# Patient Record
Sex: Male | Born: 1995
Health system: Southern US, Community
[De-identification: ages and names within clinical notes are randomized; demographics above are authoritative.]

## PROBLEM LIST (undated history)

## (undated) DIAGNOSIS — F909 Attention-deficit hyperactivity disorder, unspecified type: Secondary | ICD-10-CM

## (undated) DIAGNOSIS — F32A Depression, unspecified: Secondary | ICD-10-CM

## (undated) DIAGNOSIS — F419 Anxiety disorder, unspecified: Secondary | ICD-10-CM

## (undated) DIAGNOSIS — F39 Unspecified mood [affective] disorder: Secondary | ICD-10-CM

## (undated) HISTORY — DX: Attention-deficit hyperactivity disorder, unspecified type: F90.9

## (undated) HISTORY — PX: OTHER SURGICAL HISTORY: SHX169

## (undated) HISTORY — DX: Anxiety disorder, unspecified: F41.9

## (undated) HISTORY — DX: Depression, unspecified: F32.A

---
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
## 2016-01-04 DIAGNOSIS — J301 Allergic rhinitis due to pollen: Secondary | ICD-10-CM | POA: Diagnosis not present

## 2016-01-04 DIAGNOSIS — J3089 Other allergic rhinitis: Secondary | ICD-10-CM | POA: Diagnosis not present

## 2016-01-04 DIAGNOSIS — J3081 Allergic rhinitis due to animal (cat) (dog) hair and dander: Secondary | ICD-10-CM | POA: Diagnosis not present

## 2016-01-11 DIAGNOSIS — J321 Chronic frontal sinusitis: Secondary | ICD-10-CM | POA: Diagnosis not present

## 2016-01-11 DIAGNOSIS — J320 Chronic maxillary sinusitis: Secondary | ICD-10-CM | POA: Diagnosis not present

## 2016-01-11 MED FILL — FLUTICASONE PROP 50 MCG SPR: 50 | 30 days supply | Qty: 16 | Fill #0

## 2016-01-11 MED FILL — DOXYCYCLINE HYCLATE 100 MG: 100 | 10 days supply | Qty: 20 | Fill #0

## 2016-01-15 DIAGNOSIS — F901 Attention-deficit hyperactivity disorder, predominantly hyperactive type: Secondary | ICD-10-CM | POA: Diagnosis not present

## 2016-02-05 DIAGNOSIS — F901 Attention-deficit hyperactivity disorder, predominantly hyperactive type: Secondary | ICD-10-CM | POA: Diagnosis not present

## 2016-02-06 DIAGNOSIS — J3089 Other allergic rhinitis: Secondary | ICD-10-CM | POA: Diagnosis not present

## 2016-02-06 DIAGNOSIS — J3081 Allergic rhinitis due to animal (cat) (dog) hair and dander: Secondary | ICD-10-CM | POA: Diagnosis not present

## 2016-02-06 DIAGNOSIS — J301 Allergic rhinitis due to pollen: Secondary | ICD-10-CM | POA: Diagnosis not present

## 2016-02-13 DIAGNOSIS — J3081 Allergic rhinitis due to animal (cat) (dog) hair and dander: Secondary | ICD-10-CM | POA: Diagnosis not present

## 2016-02-13 DIAGNOSIS — J3089 Other allergic rhinitis: Secondary | ICD-10-CM | POA: Diagnosis not present

## 2016-02-13 DIAGNOSIS — J301 Allergic rhinitis due to pollen: Secondary | ICD-10-CM | POA: Diagnosis not present

## 2016-02-17 ENCOUNTER — Ambulatory Visit (HOSPITAL_COMMUNITY)
Admission: EM | Admit: 2016-02-17 | Discharge: 2016-02-17 | Disposition: A | Discharge status: Home / Self Care | Payer: 59 | Attending: Family Medicine | Admitting: Family Medicine

## 2016-02-17 ENCOUNTER — Encounter (HOSPITAL_COMMUNITY): Payer: Self-pay | Admitting: Emergency Medicine

## 2016-02-17 DIAGNOSIS — R0981 Nasal congestion: Secondary | ICD-10-CM | POA: Diagnosis not present

## 2016-02-17 MED ORDER — AZITHROMYCIN 250 MG PO TABS
ORAL_TABLET | ORAL | Status: DC
Start: 2016-02-17 — End: 2016-09-17

## 2016-02-17 NOTE — ED Provider Notes (Signed)
CSN: [id number]     Arrival date & time 02/17/16  1301 History   First MD Initiated Contact with Patient 02/17/16 1350     Chief Complaint  Patient presents with  . Facial Pain  . Nasal Congestion  . Headache   (Consider location/radiation/quality/duration/timing/severity/associated sxs/prior Treatment) HPI Cough congestion, dizzyness, headache for the last several days.  Similar symptoms about 1 month ago. Did not finish antibx at that time. Requesting zpak by name. No fever.  Past Medical History  Diagnosis Date  . Attention deficit hyperactivity disorder (ADHD)   . Mood disorder (HCC)    History reviewed. No pertinent past surgical history. History reviewed. No pertinent family history. Social History  Substance Use Topics  . Smoking status: Never Smoker   . Smokeless tobacco: None  . Alcohol Use: No    Review of Systems Nasal congestion feeling sick Allergies  Penicillins and Risperidone and related  Home Medications   Prior to Admission medications   Medication Sig Start Date End Date Taking? Authorizing Provider  cetirizine (ZYRTEC) 10 MG tablet Take 10 mg by mouth daily.   Yes Historical Provider, MD  fluticasone (FLONASE) 50 MCG/ACT nasal spray Place into both nostrils daily.   Yes Historical Provider, MD  clonazePAM (KLONOPIN) 0.5 MG tablet Take 1 tablet (0.5 mg total) by mouth daily. If needed for anxiety attack 02/15/15   Tonye Pearsonobert P Doolittle, MD  cyclobenzaprine (FLEXERIL) 10 MG tablet Take 1 tablet (10 mg total) by mouth at bedtime. 01/04/15   Tonye Pearsonobert P Doolittle, MD  Methylphenidate [facility](DAYTRANA TD) Place onto the skin.    Historical Provider, MD   Meds Ordered and Administered this Visit  Medications - No data to display  BP 124/81 mmHg  Pulse 70  Temp(Src) 97.5 F (36.4 C) (Oral)  Resp 16  SpO2 99% No data found.   Physical Exam NURSES NOTES AND VITAL SIGNS REVIEWED. CONSTITUTIONAL: Well developed, well nourished, no acute distress HEENT: normocephalic,  atraumatic, right and left TM's are normal EYES: Conjunctiva normal NECK:normal ROM, supple, no adenopathy PULMONARY:No respiratory distress, normal effort, Lungs: CTAb/l, no wheezes, or increased work of breathing CARDIOVASCULAR: RRR, no murmur ABDOMEN: soft, ND, NT, +'ve BS MUSCULOSKELETAL: Normal ROM of all extremities,  SKIN: warm and dry without rash PSYCHIATRIC: Mood and affect, behavior are normal  ED Course  Procedures (including critical care time)  Labs Review Labs Reviewed - No data to display  Imaging Review No results found.   Visual Acuity Review  Right Eye Distance:   Left Eye Distance:   Bilateral Distance:    Right Eye Near:   Left Eye Near:    Bilateral Near:      Rx zpak   MDM   1. Nasal sinus congestion     Patient is reassured that there are no issues that require transfer to higher level of care at this time or additional tests. Patient is advised to continue home symptomatic treatment. Patient is advised that if there are new or worsening symptoms to attend the emergency department, contact primary care provider, or return to UC. Instructions of care provided discharged home in stable condition.    THIS NOTE WAS GENERATED USING A VOICE RECOGNITION SOFTWARE PROGRAM. ALL REASONABLE EFFORTS  WERE MADE TO PROOFREAD THIS DOCUMENT FOR ACCURACY.  I have verbally reviewed the discharge instructions with the patient. A printed AVS was given to the patient.  All questions were answered prior to discharge.      Tharon AquasFrank C Patrick, PA 02/17/16 1414

## 2016-02-17 NOTE — Discharge Instructions (Signed)
Sinusitis, Adult  Sinusitis is redness, soreness, and puffiness (inflammation) of the air pockets in the bones of your face (sinuses). The redness, soreness, and puffiness can cause air and mucus to get trapped in your sinuses. This can allow germs to grow and cause an infection.   HOME CARE    Drink enough fluids to keep your pee (urine) clear or pale yellow.   Use a humidifier in your home.   Run a hot shower to create steam in the bathroom. Sit in the bathroom with the door closed. Breathe in the steam 3-4 times a day.   Put a warm, moist washcloth on your face 3-4 times a day, or as told by your doctor.   Use salt water sprays (saline sprays) to wet the thick fluid in your nose. This can help the sinuses drain.   Only take medicine as told by your doctor.  GET HELP RIGHT AWAY IF:    Your pain gets worse.   You have very bad headaches.   You are sick to your stomach (nauseous).   You throw up (vomit).   You are very sleepy (drowsy) all the time.   Your face is puffy (swollen).   Your vision changes.   You have a stiff neck.   You have trouble breathing.  MAKE SURE YOU:    Understand these instructions.   Will watch your condition.   Will get help right away if you are not doing well or get worse.     This information is not intended to replace advice given to you by your health care provider. Make sure you discuss any questions you have with your health care provider.     Document Released: 04/07/2008 Document Revised: 11/10/2014 Document Reviewed: 05/25/2012  Elsevier Interactive Patient Education 2016 Elsevier Inc.

## 2016-02-17 NOTE — ED Notes (Signed)
[facility] a [facility] of sinus pain and pressure, headache and chest congestion x 1 week. The patient stated that he has a hx of allergy issues.

## 2016-02-26 DIAGNOSIS — F901 Attention-deficit hyperactivity disorder, predominantly hyperactive type: Secondary | ICD-10-CM | POA: Diagnosis not present

## 2016-03-19 DIAGNOSIS — F901 Attention-deficit hyperactivity disorder, predominantly hyperactive type: Secondary | ICD-10-CM | POA: Diagnosis not present

## 2016-04-14 DIAGNOSIS — F901 Attention-deficit hyperactivity disorder, predominantly hyperactive type: Secondary | ICD-10-CM | POA: Diagnosis not present

## 2016-05-19 DIAGNOSIS — F901 Attention-deficit hyperactivity disorder, predominantly hyperactive type: Secondary | ICD-10-CM | POA: Diagnosis not present

## 2016-06-02 DIAGNOSIS — J3081 Allergic rhinitis due to animal (cat) (dog) hair and dander: Secondary | ICD-10-CM | POA: Diagnosis not present

## 2016-06-02 DIAGNOSIS — H1045 Other chronic allergic conjunctivitis: Secondary | ICD-10-CM | POA: Diagnosis not present

## 2016-06-02 DIAGNOSIS — J301 Allergic rhinitis due to pollen: Secondary | ICD-10-CM | POA: Diagnosis not present

## 2016-06-02 DIAGNOSIS — J3089 Other allergic rhinitis: Secondary | ICD-10-CM | POA: Diagnosis not present

## 2016-06-05 DIAGNOSIS — J301 Allergic rhinitis due to pollen: Secondary | ICD-10-CM | POA: Diagnosis not present

## 2016-06-05 DIAGNOSIS — J3081 Allergic rhinitis due to animal (cat) (dog) hair and dander: Secondary | ICD-10-CM | POA: Diagnosis not present

## 2016-06-05 DIAGNOSIS — J3089 Other allergic rhinitis: Secondary | ICD-10-CM | POA: Diagnosis not present

## 2016-06-16 DIAGNOSIS — F901 Attention-deficit hyperactivity disorder, predominantly hyperactive type: Secondary | ICD-10-CM | POA: Diagnosis not present

## 2016-07-08 DIAGNOSIS — F901 Attention-deficit hyperactivity disorder, predominantly hyperactive type: Secondary | ICD-10-CM | POA: Diagnosis not present

## 2016-07-11 ENCOUNTER — Telehealth (HOSPITAL_COMMUNITY): Payer: Self-pay

## 2016-07-11 DIAGNOSIS — R0981 Nasal congestion: Secondary | ICD-10-CM | POA: Diagnosis not present

## 2016-07-11 MED FILL — predniSONE 20 MG TABS: 20 | 6 days supply | Qty: 12 | Fill #0

## 2016-07-21 DIAGNOSIS — F902 Attention-deficit hyperactivity disorder, combined type: Secondary | ICD-10-CM | POA: Diagnosis not present

## 2016-07-21 MED FILL — ALPRAZolam 0.5 MG TABS: 0.5 | 30 days supply | Qty: 60 | Fill #0

## 2016-07-21 MED FILL — DEXMETHYLPHENIDATE 10 MG TA: 10 | 30 days supply | Qty: 60 | Fill #0

## 2016-07-29 DIAGNOSIS — F901 Attention-deficit hyperactivity disorder, predominantly hyperactive type: Secondary | ICD-10-CM | POA: Diagnosis not present

## 2016-08-19 DIAGNOSIS — F901 Attention-deficit hyperactivity disorder, predominantly hyperactive type: Secondary | ICD-10-CM | POA: Diagnosis not present

## 2016-09-09 DIAGNOSIS — F901 Attention-deficit hyperactivity disorder, predominantly hyperactive type: Secondary | ICD-10-CM | POA: Diagnosis not present

## 2016-09-11 ENCOUNTER — Emergency Department (HOSPITAL_COMMUNITY)
Admission: EM | Admit: 2016-09-11 | Discharge: 2016-09-11 | Disposition: A | Discharge status: Home / Self Care | Payer: 59 | Attending: Emergency Medicine | Admitting: Emergency Medicine

## 2016-09-11 ENCOUNTER — Encounter (HOSPITAL_COMMUNITY): Payer: Self-pay | Admitting: *Deleted

## 2016-09-11 DIAGNOSIS — R197 Diarrhea, unspecified: Secondary | ICD-10-CM | POA: Insufficient documentation

## 2016-09-11 DIAGNOSIS — F909 Attention-deficit hyperactivity disorder, unspecified type: Secondary | ICD-10-CM | POA: Diagnosis not present

## 2016-09-11 DIAGNOSIS — R112 Nausea with vomiting, unspecified: Secondary | ICD-10-CM | POA: Insufficient documentation

## 2016-09-11 LAB — COMPREHENSIVE METABOLIC PANEL
ALBUMIN: 4.5 g/dL (ref 3.5–5.0)
ALK PHOS: 85 U/L (ref 38–126)
ALT: 18 U/L (ref 17–63)
ANION GAP: 11 (ref 5–15)
AST: 24 U/L (ref 15–41)
BILIRUBIN TOTAL: 0.9 mg/dL (ref 0.3–1.2)
BUN: 11 mg/dL (ref 6–20)
CALCIUM: 9.9 mg/dL (ref 8.9–10.3)
CO2: 22 mmol/L (ref 22–32)
Chloride: 106 mmol/L (ref 101–111)
Creatinine, Ser: 0.93 mg/dL (ref 0.61–1.24)
GFR calc non Af Amer: >60 mL/min (ref >60)
Glucose, Bld: 127 mg/dL — ABNORMAL HIGH (ref 65–99)
POTASSIUM: 4.2 mmol/L (ref 3.5–5.1)
SODIUM: 139 mmol/L (ref 135–145)
TOTAL PROTEIN: 7.4 g/dL (ref 6.5–8.1)

## 2016-09-11 LAB — URINE MICROSCOPIC-ADD ON

## 2016-09-11 LAB — URINALYSIS, ROUTINE W REFLEX MICROSCOPIC
Bilirubin Urine: NEGATIVE
Glucose, UA: NEGATIVE mg/dL
Hgb urine dipstick: NEGATIVE
Ketones, ur: NEGATIVE mg/dL
LEUKOCYTES UA: NEGATIVE
NITRITE: NEGATIVE
PH: 8.5 — AB (ref 5.0–8.0)
Protein, ur: 30 mg/dL — AB
SPECIFIC GRAVITY, URINE: 1.024 (ref 1.005–1.030)

## 2016-09-11 LAB — CBC
HEMATOCRIT: 44 % (ref 39.0–52.0)
HEMOGLOBIN: 15.9 g/dL (ref 13.0–17.0)
MCH: 31.3 pg (ref 26.0–34.0)
MCHC: 36.1 g/dL — ABNORMAL HIGH (ref 30.0–36.0)
MCV: 86.6 fL (ref 78.0–100.0)
Platelets: 284 10*3/uL (ref 150–400)
RBC: 5.08 MIL/uL (ref 4.22–5.81)
RDW: 11.7 % (ref 11.5–15.5)
WBC: 10.5 10*3/uL (ref 4.0–10.5)

## 2016-09-11 LAB — LIPASE, BLOOD: Lipase: 22 U/L (ref 11–51)

## 2016-09-11 MED ORDER — ONDANSETRON HCL 4 MG/2ML IJ SOLN
4.0000 mg | Freq: Once | INTRAMUSCULAR | Status: AC
  Administered 2016-09-11: 4 mg via INTRAVENOUS
  Filled 2016-09-11: qty 2

## 2016-09-11 MED ORDER — ONDANSETRON HCL 4 MG PO TABS: 4.0000 mg | ORAL_TABLET | Freq: Three times a day (TID) | ORAL | 0 refills | Status: DC | PRN

## 2016-09-11 MED ORDER — SODIUM CHLORIDE 0.9 % IV BOLUS (SEPSIS)
1000.0000 mL | Freq: Once | INTRAVENOUS | Status: AC
  Administered 2016-09-11: 1000 mL via INTRAVENOUS

## 2016-09-11 MED FILL — ONDANSETRON HCL 4 MG TABLET: 4 | 4 days supply | Qty: 12 | Fill #0

## 2016-09-11 NOTE — ED Triage Notes (Signed)
Pt states multiple bouts of diarrhea and emesis since this am.  Pale and diaphoretic.  Denies any med hx.

## 2016-09-11 NOTE — ED Provider Notes (Signed)
[facility]-EMERGENCY DEPT Provider Note   CSN: [id number] Arrival date & time: 09/11/16  1227     History   Chief Complaint Chief Complaint  Patient presents with  . Diarrhea  . Emesis    HPI Patrick Burnett is a 20 y.o. male.  HPI  20 year old male who presents with nausea, vomiting, and diarrhea. He has a history of ADHD, no prior abdominal surgeries. States that he was in his usual state of health up until this morning. He will At 7 AM feeling unwell. He had 8-9 episodes of nonbloody and nonbilious emesis. Also had multiple episodes of diarrhea, abdominal cramping, and did notice a streak of blood in his diarrhea. No fevers but states that he one point was very diaphoretic and lightheaded and pale. No known sick contacts, but states eating cafeteria food yesterday and there has been multiple episodes of food poisoning through this cafeteria. No recent travel or antibiotics.  Past Medical History:  Diagnosis Date  . Attention deficit hyperactivity disorder (ADHD)   . Mood disorder [facility](HCC)     Patient Active Problem List   Diagnosis Date Noted  . Poor sleep hygiene 01/04/2015  . Mood disorder (HCC) 12/31/2012  . ADHD (attention deficit hyperactivity disorder) 12/30/2012  . Insomnia 12/30/2012    History reviewed. No pertinent surgical history.     Home Medications    Prior to Admission medications   Medication Sig Start Date End Date Taking? Authorizing Provider  cetirizine (ZYRTEC) 10 MG tablet Take 10 mg by mouth daily.   Yes Historical Provider, MD  fluticasone (FLONASE) 50 MCG/ACT nasal spray Place into both nostrils daily.   Yes Historical Provider, MD  azithromycin (ZITHROMAX Z-PAK) 250 MG tablet 2 tablets day 1, then 1 tablet days 2-5 by mouth. Patient not taking: Reported on 09/11/2016 02/17/16   Tharon AquasFrank C Patrick, PA  clonazePAM (KLONOPIN) 0.5 MG tablet Take 1 tablet (0.5 mg total) by mouth daily. If needed for anxiety attack 02/15/15   Tonye Pearsonobert P Doolittle, MD    cyclobenzaprine (FLEXERIL) 10 MG tablet Take 1 tablet (10 mg total) by mouth at bedtime. 01/04/15   Tonye Pearsonobert P Doolittle, MD  Methylphenidate [facility](DAYTRANA TD) Place onto the skin.    Historical Provider, MD  ondansetron (ZOFRAN) 4 MG tablet Take 1 tablet (4 mg total) by mouth every 8 (eight) hours as needed for nausea or vomiting. 09/11/16   Lavera Guiseana Duo Denard Tuminello, MD    Family History No family history on file. Reviewed, noncontributory. Social History Social History  Substance Use Topics  . Smoking status: Never Smoker  . Smokeless tobacco: Never Used  . Alcohol use No     Allergies   Amoxicillin and Risperidone and related   Review of Systems Review of Systems 10/14 systems reviewed and are negative other than those stated in the HPI   Physical Exam Updated Vital Signs BP 133/79 (BP Location: Left Arm)   Pulse 69   Temp 98.7 F (37.1 C) (Oral)   Resp 15   Ht 5\' 7"  (1.702 m)   Wt 150 lb (68 kg)   SpO2 100%   BMI 23.49 kg/m   Physical Exam Physical Exam  Nursing note and vitals reviewed. Constitutional: non-toxic, and in no acute distress Head: Normocephalic and atraumatic.  Mouth/Throat: Oropharynx is clear and dry.  Neck: Normal range of motion. Neck supple.  Cardiovascular: Normal rate and regular rhythm. No edema   Pulmonary/Chest: Effort normal and breath sounds normal.  Abdominal: Soft. There is no tenderness. There  is no rebound and no guarding.  Musculoskeletal: Normal range of motion.  Neurological: Alert, no facial droop, fluent speech, moves all extremities symmetrically Skin: Skin is warm and dry.  Psychiatric: Cooperative   ED Treatments / Results  Labs (all labs ordered are listed, but only abnormal results are displayed) Labs Reviewed  COMPREHENSIVE METABOLIC PANEL - Abnormal; Notable for the following:       Result Value   Glucose, Bld 127 (*)    All other components within normal limits  CBC - Abnormal; Notable for the following:    MCHC 36.1 (*)     All other components within normal limits  URINALYSIS, ROUTINE W REFLEX MICROSCOPIC (NOT AT [facility]) - Abnormal; Notable for the following:    pH 8.5 (*)    Protein, ur 30 (*)    All other components within normal limits  URINE MICROSCOPIC-ADD ON - Abnormal; Notable for the following:    Squamous Epithelial / LPF 0-5 (*)    Bacteria, UA MANY (*)    Casts HYALINE CASTS (*)    All other components within normal limits  LIPASE, BLOOD    EKG  EKG Interpretation None       Radiology No results found.  Procedures Procedures (including critical care time)  Medications Ordered in ED Medications  sodium chloride 0.9 % bolus 1,000 mL (0 mLs Intravenous Stopped 09/11/16 1541)  sodium chloride 0.9 % bolus 1,000 mL (0 mLs Intravenous Stopped 09/11/16 1541)  ondansetron (ZOFRAN) injection 4 mg (4 mg Intravenous Given 09/11/16 1428)     Initial Impression / Assessment and Plan / ED Course  I have reviewed the triage vital signs and the nursing notes.  Pertinent labs & imaging results that were available during my care of the patient were reviewed by me and considered in my medical decision making (see chart for details).  Clinical Course     20 year old male who presents with nausea, vomiting, and diarrhea. Afebrile and hemodynamically stable. Mildly dry on exam, was soft and benign abdomen. Exam not consistent with that of appendicitis or diverticulitis, or other surgical or emergent intraabdominal process at this time. Basic blood work is reassuring. Given IV fluids and antibiotics, and feels improved. Abdomen remains benign, he is able to tolerate by mouth challenge. I feel that he is stable for discharge home with continued outpatient supportive management.   The patient appears reasonably screened and/or stabilized for discharge and I doubt any other medical condition or other [facility] requiring further screening, evaluation, or treatment in the ED at this time prior to discharge.  Strict  return and follow-up instructions reviewed. He expressed understanding of all discharge instructions and felt comfortable with the plan of care.   Final Clinical Impressions(s) / ED Diagnoses   Final diagnoses:  Nausea vomiting and diarrhea    New Prescriptions New Prescriptions   ONDANSETRON (ZOFRAN) 4 MG TABLET    Take 1 tablet (4 mg total) by mouth every 8 (eight) hours as needed for nausea or vomiting.     Lavera Guiseana Duo Hillel Card, MD 09/11/16 [phone]

## 2016-09-11 NOTE — Discharge Instructions (Signed)
Return without fail for worsening symptoms, including fever, escalating pain, severe bleeding, intractable vomiting despite nausea medications, concern for dehydration or any other symptoms concerning to you.

## 2016-09-12 ENCOUNTER — Encounter: Payer: Self-pay | Admitting: Gastroenterology

## 2016-09-12 DIAGNOSIS — R112 Nausea with vomiting, unspecified: Secondary | ICD-10-CM | POA: Diagnosis not present

## 2016-09-17 ENCOUNTER — Encounter: Payer: Self-pay | Admitting: Gastroenterology

## 2016-09-17 ENCOUNTER — Ambulatory Visit (INDEPENDENT_AMBULATORY_CARE_PROVIDER_SITE_OTHER): Payer: 59 | Admitting: Gastroenterology

## 2016-09-17 VITALS — BP 84/50 | HR 80 | Ht 67.52 in | Wt 147.5 lb

## 2016-09-17 DIAGNOSIS — R197 Diarrhea, unspecified: Secondary | ICD-10-CM | POA: Diagnosis not present

## 2016-09-17 DIAGNOSIS — K625 Hemorrhage of anus and rectum: Secondary | ICD-10-CM | POA: Diagnosis not present

## 2016-09-17 DIAGNOSIS — R1032 Left lower quadrant pain: Secondary | ICD-10-CM | POA: Diagnosis not present

## 2016-09-17 MED ORDER — NA SULFATE-K SULFATE-MG SULF 17.5-3.13-1.6 GM/177ML PO SOLN: 1.0000 | Freq: Once | ORAL | 0 refills | Status: AC

## 2016-09-17 MED ORDER — NA SULFATE-K SULFATE-MG SULF 17.5-3.13-1.6 GM/177ML PO SOLN: 1.0000 | Freq: Once | ORAL | 0 refills | Status: DC

## 2016-09-17 NOTE — Progress Notes (Signed)
09/17/2016 Patrick Burnett [id number] 16-Feb-1996   HISTORY OF PRESENT ILLNESS:  This is a 20 year old male who is new to our practice.  He is here today with his mom.  Has several complaints including intermittent diarrhea that he has been experiencing on and off for the past 8 months.  He thinks that the diarrhea is related to eating the food at his college ([employer]) and says that while he was eating at home all summer he did not have any diarrhea at all.  Also then has alternating episodes of constipation that are associated with abdominal pain.  More acutely, last week he was seen in the ER for complaints of diarrhea, vomiting, abdominal pain, and blood in his stool.  Was treated with dose of antibiotics, IVFs, pain medication, and antiemetics.  CBC, CMP, and lipase were unremarkable.  Bleeding has since resolved but patient describes this as moderate amounts on a couple of occasions.  This seems to be his biggest concern today.  He was very dramatic in the office today complaining of dizziness.  Patient's BP was low but then orthostatic BP's were taken and were ok.  It was offered to him for us to call EMS to go to the ED if he did not feel well enough to go home but declined after causing some slight commotion with the staff.  Past Medical History:  Diagnosis Date  . Attention deficit hyperactivity disorder (ADHD)   . Mood disorder [facility](HCC)    Past Surgical History:  Procedure Laterality Date  . none      reports that he has never smoked. He has never used smokeless tobacco. He reports that he does not drink alcohol or use drugs. family history includes Anxiety disorder in his father and mother; Colon cancer in his paternal grandfather; Diabetes in his father; Hypercholesterolemia in his father and mother; Sleep apnea in his mother. Allergies  Allergen Reactions  . Amoxicillin Nausea And Vomiting  . Risperidone And Related       Outpatient Encounter Prescriptions as of 09/17/2016    Medication Sig  . ALPRAZolam (XANAX) 0.25 MG tablet Take 0.25 mg by mouth as needed for anxiety.  . fluticasone (FLONASE) 50 MCG/ACT nasal spray Place into both nostrils daily.  Marland Kitchen. loratadine (CLARITIN) 10 MG tablet Take 10 mg by mouth daily.  . [DISCONTINUED] azithromycin (ZITHROMAX Z-PAK) 250 MG tablet 2 tablets day 1, then 1 tablet days 2-5 by mouth. (Patient not taking: Reported on 09/11/2016)  . [DISCONTINUED] cetirizine (ZYRTEC) 10 MG tablet Take 10 mg by mouth daily.  . [DISCONTINUED] clonazePAM (KLONOPIN) 0.5 MG tablet Take 1 tablet (0.5 mg total) by mouth daily. If needed for anxiety attack  . [DISCONTINUED] cyclobenzaprine (FLEXERIL) 10 MG tablet Take 1 tablet (10 mg total) by mouth at bedtime.  . [DISCONTINUED] Methylphenidate (DAYTRANA TD) Place onto the skin.  . [DISCONTINUED] ondansetron (ZOFRAN) 4 MG tablet Take 1 tablet (4 mg total) by mouth every 8 (eight) hours as needed for nausea or vomiting.   No facility-administered encounter medications on file as of 09/17/2016.      REVIEW OF SYSTEMS  : All other systems reviewed and negative except where noted in the History of Present Illness.   PHYSICAL EXAM: BP (!) 84/50   Pulse 80   Ht 5' 7.52" (1.715 m)   Wt 147 lb 8 oz (66.9 kg)   BMI 22.75 kg/m  General: Well developed white male in no acute distress; laying on the bed  but appears comfortable. Head: Normocephalic and atraumatic Eyes:  Sclerae anicteric, conjunctiva pink. Ears: Normal auditory acuity Lungs: Clear throughout to auscultation Heart: Regular rate and rhythm Abdomen: Soft, non-distended.  Normal bowel sounds.  Diffuse TTP > in the LLQ. Rectal:  Will be done at the time of colonoscopy. Musculoskeletal: Symmetrical with no gross deformities  Skin: No lesions on visible extremities Extremities: No edema  Neurological: Alert oriented x 4, grossly non-focal Psychological:  Alert and cooperative. Normal mood and affect  ASSESSMENT AND PLAN: -20 year old  male with chronic complaints of alternating diarrhea and constipation with associated abdominal pain, which may be dietary/stress related, IBS (he is convinced that it is just when he eats at the cafeteria at his college).  More acutely he had acute vomiting, diarrhea, and abdominal with associated bloody stools.  Possibly infectious source.  Basic labs unremarkable in ED.  Patient most concerned about the rectal bleeding, which has now resolved.  This was self limited and likely hemorrhoidal in origin/assoicated with his acute diarrheal illness.  Will schedule for colonoscopy for piece of mind of patient and his mother.  The risks, benefits, and alternatives to colonoscopy were discussed with the patient and he consents to proceed.    CC:  Catha GosselinLittle, Kevin, MD

## 2016-09-17 NOTE — Patient Instructions (Signed)
You have been scheduled for a colonoscopy. Please follow written instructions given to you at your visit today.  Please pick up your prep supplies at the pharmacy within the next 1-3 days. If you use inhalers (even only as needed), please bring them with you on the day of your procedure. Your physician has requested that you go to www.startemmi.com and enter the access code given to you at your visit today. This web site gives a general overview about your procedure. However, you should still follow specific instructions given to you by our office regarding your preparation for the procedure.   Make sure to hydrate well with water or Gatorade  Follow up with your PCP, or, if necessary, the ER or Urgent Care, for your headaches and dizziness

## 2016-09-30 DIAGNOSIS — F901 Attention-deficit hyperactivity disorder, predominantly hyperactive type: Secondary | ICD-10-CM | POA: Diagnosis not present

## 2016-10-04 ENCOUNTER — Encounter: Payer: Self-pay | Admitting: Gastroenterology

## 2016-10-04 DIAGNOSIS — F902 Attention-deficit hyperactivity disorder, combined type: Secondary | ICD-10-CM | POA: Insufficient documentation

## 2016-10-04 DIAGNOSIS — K625 Hemorrhage of anus and rectum: Secondary | ICD-10-CM | POA: Insufficient documentation

## 2016-10-04 DIAGNOSIS — R1032 Left lower quadrant pain: Secondary | ICD-10-CM | POA: Insufficient documentation

## 2016-10-06 NOTE — Progress Notes (Signed)
I agre with the above note, plan 

## 2016-10-13 ENCOUNTER — Encounter: Payer: 59 | Admitting: Gastroenterology

## 2016-10-13 DIAGNOSIS — F902 Attention-deficit hyperactivity disorder, combined type: Secondary | ICD-10-CM | POA: Diagnosis not present

## 2016-10-14 DIAGNOSIS — F901 Attention-deficit hyperactivity disorder, predominantly hyperactive type: Secondary | ICD-10-CM | POA: Diagnosis not present

## 2016-10-21 MED FILL — DEXTROAMPHETAMINE 5 MG TAB: 5 | 30 days supply | Qty: 90 | Fill #0

## 2016-11-18 DIAGNOSIS — F901 Attention-deficit hyperactivity disorder, predominantly hyperactive type: Secondary | ICD-10-CM | POA: Diagnosis not present

## 2016-12-02 DIAGNOSIS — F901 Attention-deficit hyperactivity disorder, predominantly hyperactive type: Secondary | ICD-10-CM | POA: Diagnosis not present

## 2016-12-23 DIAGNOSIS — F901 Attention-deficit hyperactivity disorder, predominantly hyperactive type: Secondary | ICD-10-CM | POA: Diagnosis not present

## 2017-01-02 DIAGNOSIS — F901 Attention-deficit hyperactivity disorder, predominantly hyperactive type: Secondary | ICD-10-CM | POA: Diagnosis not present

## 2017-01-06 DIAGNOSIS — H5203 Hypermetropia, bilateral: Secondary | ICD-10-CM | POA: Diagnosis not present

## 2017-01-12 ENCOUNTER — Ambulatory Visit (HOSPITAL_COMMUNITY)
Admission: EM | Admit: 2017-01-12 | Discharge: 2017-01-12 | Disposition: A | Discharge status: Home / Self Care | Payer: 59

## 2017-01-13 DIAGNOSIS — F901 Attention-deficit hyperactivity disorder, predominantly hyperactive type: Secondary | ICD-10-CM | POA: Diagnosis not present

## 2017-01-22 DIAGNOSIS — M79671 Pain in right foot: Secondary | ICD-10-CM | POA: Diagnosis not present

## 2017-02-02 DIAGNOSIS — F902 Attention-deficit hyperactivity disorder, combined type: Secondary | ICD-10-CM | POA: Diagnosis not present

## 2017-02-03 DIAGNOSIS — F901 Attention-deficit hyperactivity disorder, predominantly hyperactive type: Secondary | ICD-10-CM | POA: Diagnosis not present

## 2017-02-17 DIAGNOSIS — F901 Attention-deficit hyperactivity disorder, predominantly hyperactive type: Secondary | ICD-10-CM | POA: Diagnosis not present

## 2017-03-18 DIAGNOSIS — F901 Attention-deficit hyperactivity disorder, predominantly hyperactive type: Secondary | ICD-10-CM | POA: Diagnosis not present

## 2017-04-01 DIAGNOSIS — J3081 Allergic rhinitis due to animal (cat) (dog) hair and dander: Secondary | ICD-10-CM | POA: Diagnosis not present

## 2017-04-01 DIAGNOSIS — J301 Allergic rhinitis due to pollen: Secondary | ICD-10-CM | POA: Diagnosis not present

## 2017-04-02 DIAGNOSIS — J3089 Other allergic rhinitis: Secondary | ICD-10-CM | POA: Diagnosis not present

## 2017-04-02 DIAGNOSIS — J301 Allergic rhinitis due to pollen: Secondary | ICD-10-CM | POA: Diagnosis not present

## 2017-04-02 DIAGNOSIS — J3081 Allergic rhinitis due to animal (cat) (dog) hair and dander: Secondary | ICD-10-CM | POA: Diagnosis not present

## 2017-04-06 DIAGNOSIS — J3089 Other allergic rhinitis: Secondary | ICD-10-CM | POA: Diagnosis not present

## 2017-04-06 DIAGNOSIS — J3081 Allergic rhinitis due to animal (cat) (dog) hair and dander: Secondary | ICD-10-CM | POA: Diagnosis not present

## 2017-04-06 DIAGNOSIS — J301 Allergic rhinitis due to pollen: Secondary | ICD-10-CM | POA: Diagnosis not present

## 2017-04-08 DIAGNOSIS — J3089 Other allergic rhinitis: Secondary | ICD-10-CM | POA: Diagnosis not present

## 2017-04-08 DIAGNOSIS — J301 Allergic rhinitis due to pollen: Secondary | ICD-10-CM | POA: Diagnosis not present

## 2017-04-08 DIAGNOSIS — J3081 Allergic rhinitis due to animal (cat) (dog) hair and dander: Secondary | ICD-10-CM | POA: Diagnosis not present

## 2017-04-14 DIAGNOSIS — J3089 Other allergic rhinitis: Secondary | ICD-10-CM | POA: Diagnosis not present

## 2017-04-14 DIAGNOSIS — J3081 Allergic rhinitis due to animal (cat) (dog) hair and dander: Secondary | ICD-10-CM | POA: Diagnosis not present

## 2017-04-14 DIAGNOSIS — J301 Allergic rhinitis due to pollen: Secondary | ICD-10-CM | POA: Diagnosis not present

## 2017-04-21 DIAGNOSIS — J3089 Other allergic rhinitis: Secondary | ICD-10-CM | POA: Diagnosis not present

## 2017-04-21 DIAGNOSIS — J3081 Allergic rhinitis due to animal (cat) (dog) hair and dander: Secondary | ICD-10-CM | POA: Diagnosis not present

## 2017-04-21 DIAGNOSIS — J301 Allergic rhinitis due to pollen: Secondary | ICD-10-CM | POA: Diagnosis not present

## 2017-04-23 DIAGNOSIS — J3089 Other allergic rhinitis: Secondary | ICD-10-CM | POA: Diagnosis not present

## 2017-04-23 DIAGNOSIS — J301 Allergic rhinitis due to pollen: Secondary | ICD-10-CM | POA: Diagnosis not present

## 2017-04-23 DIAGNOSIS — J3081 Allergic rhinitis due to animal (cat) (dog) hair and dander: Secondary | ICD-10-CM | POA: Diagnosis not present

## 2017-05-04 DIAGNOSIS — J3089 Other allergic rhinitis: Secondary | ICD-10-CM | POA: Diagnosis not present

## 2017-05-04 DIAGNOSIS — J3081 Allergic rhinitis due to animal (cat) (dog) hair and dander: Secondary | ICD-10-CM | POA: Diagnosis not present

## 2017-05-04 DIAGNOSIS — J301 Allergic rhinitis due to pollen: Secondary | ICD-10-CM | POA: Diagnosis not present

## 2017-05-08 DIAGNOSIS — J3081 Allergic rhinitis due to animal (cat) (dog) hair and dander: Secondary | ICD-10-CM | POA: Diagnosis not present

## 2017-05-08 DIAGNOSIS — J3089 Other allergic rhinitis: Secondary | ICD-10-CM | POA: Diagnosis not present

## 2017-05-08 DIAGNOSIS — J301 Allergic rhinitis due to pollen: Secondary | ICD-10-CM | POA: Diagnosis not present

## 2017-05-11 DIAGNOSIS — J3089 Other allergic rhinitis: Secondary | ICD-10-CM | POA: Diagnosis not present

## 2017-05-11 DIAGNOSIS — J3081 Allergic rhinitis due to animal (cat) (dog) hair and dander: Secondary | ICD-10-CM | POA: Diagnosis not present

## 2017-05-11 DIAGNOSIS — J301 Allergic rhinitis due to pollen: Secondary | ICD-10-CM | POA: Diagnosis not present

## 2017-05-15 DIAGNOSIS — J3081 Allergic rhinitis due to animal (cat) (dog) hair and dander: Secondary | ICD-10-CM | POA: Diagnosis not present

## 2017-05-15 DIAGNOSIS — J301 Allergic rhinitis due to pollen: Secondary | ICD-10-CM | POA: Diagnosis not present

## 2017-05-15 DIAGNOSIS — J3089 Other allergic rhinitis: Secondary | ICD-10-CM | POA: Diagnosis not present

## 2017-05-25 DIAGNOSIS — F902 Attention-deficit hyperactivity disorder, combined type: Secondary | ICD-10-CM | POA: Diagnosis not present

## 2017-05-25 DIAGNOSIS — J3081 Allergic rhinitis due to animal (cat) (dog) hair and dander: Secondary | ICD-10-CM | POA: Diagnosis not present

## 2017-05-25 DIAGNOSIS — J3089 Other allergic rhinitis: Secondary | ICD-10-CM | POA: Diagnosis not present

## 2017-05-25 DIAGNOSIS — J301 Allergic rhinitis due to pollen: Secondary | ICD-10-CM | POA: Diagnosis not present

## 2017-06-01 DIAGNOSIS — J3089 Other allergic rhinitis: Secondary | ICD-10-CM | POA: Diagnosis not present

## 2017-06-01 DIAGNOSIS — J301 Allergic rhinitis due to pollen: Secondary | ICD-10-CM | POA: Diagnosis not present

## 2017-06-01 DIAGNOSIS — J3081 Allergic rhinitis due to animal (cat) (dog) hair and dander: Secondary | ICD-10-CM | POA: Diagnosis not present

## 2017-06-05 DIAGNOSIS — J301 Allergic rhinitis due to pollen: Secondary | ICD-10-CM | POA: Diagnosis not present

## 2017-06-05 DIAGNOSIS — J3081 Allergic rhinitis due to animal (cat) (dog) hair and dander: Secondary | ICD-10-CM | POA: Diagnosis not present

## 2017-06-05 DIAGNOSIS — J3089 Other allergic rhinitis: Secondary | ICD-10-CM | POA: Diagnosis not present

## 2017-06-15 DIAGNOSIS — J3081 Allergic rhinitis due to animal (cat) (dog) hair and dander: Secondary | ICD-10-CM | POA: Diagnosis not present

## 2017-06-15 DIAGNOSIS — J3089 Other allergic rhinitis: Secondary | ICD-10-CM | POA: Diagnosis not present

## 2017-06-15 DIAGNOSIS — J301 Allergic rhinitis due to pollen: Secondary | ICD-10-CM | POA: Diagnosis not present

## 2017-06-19 MED FILL — EPINEPHRINE 0.3 MG AUTO-INJ: 0.3 | 30 days supply | Qty: 2 | Fill #0

## 2017-06-24 DIAGNOSIS — F901 Attention-deficit hyperactivity disorder, predominantly hyperactive type: Secondary | ICD-10-CM | POA: Diagnosis not present

## 2017-06-25 DIAGNOSIS — J301 Allergic rhinitis due to pollen: Secondary | ICD-10-CM | POA: Diagnosis not present

## 2017-06-25 DIAGNOSIS — J3089 Other allergic rhinitis: Secondary | ICD-10-CM | POA: Diagnosis not present

## 2017-06-25 DIAGNOSIS — J3081 Allergic rhinitis due to animal (cat) (dog) hair and dander: Secondary | ICD-10-CM | POA: Diagnosis not present

## 2017-07-16 DIAGNOSIS — F901 Attention-deficit hyperactivity disorder, predominantly hyperactive type: Secondary | ICD-10-CM | POA: Diagnosis not present

## 2017-07-29 DIAGNOSIS — F901 Attention-deficit hyperactivity disorder, predominantly hyperactive type: Secondary | ICD-10-CM | POA: Diagnosis not present

## 2017-08-12 DIAGNOSIS — F901 Attention-deficit hyperactivity disorder, predominantly hyperactive type: Secondary | ICD-10-CM | POA: Diagnosis not present

## 2017-08-19 DIAGNOSIS — F902 Attention-deficit hyperactivity disorder, combined type: Secondary | ICD-10-CM | POA: Diagnosis not present

## 2017-08-19 MED FILL — DEXTROAMPHETAMINE 5 MG TAB: 5 | 30 days supply | Qty: 90 | Fill #0

## 2017-08-19 MED FILL — ALPRAZolam 0.5 MG TABS: 0.5 | 30 days supply | Qty: 60 | Fill #0

## 2017-09-02 DIAGNOSIS — F901 Attention-deficit hyperactivity disorder, predominantly hyperactive type: Secondary | ICD-10-CM | POA: Diagnosis not present

## 2017-09-16 DIAGNOSIS — F901 Attention-deficit hyperactivity disorder, predominantly hyperactive type: Secondary | ICD-10-CM | POA: Diagnosis not present

## 2017-10-19 DIAGNOSIS — J3081 Allergic rhinitis due to animal (cat) (dog) hair and dander: Secondary | ICD-10-CM | POA: Diagnosis not present

## 2017-10-19 DIAGNOSIS — J301 Allergic rhinitis due to pollen: Secondary | ICD-10-CM | POA: Diagnosis not present

## 2017-10-19 DIAGNOSIS — H1045 Other chronic allergic conjunctivitis: Secondary | ICD-10-CM | POA: Diagnosis not present

## 2017-10-19 DIAGNOSIS — J3089 Other allergic rhinitis: Secondary | ICD-10-CM | POA: Diagnosis not present

## 2017-10-23 DIAGNOSIS — J101 Influenza due to other identified influenza virus with other respiratory manifestations: Secondary | ICD-10-CM | POA: Diagnosis not present

## 2017-10-23 DIAGNOSIS — J028 Acute pharyngitis due to other specified organisms: Secondary | ICD-10-CM | POA: Diagnosis not present

## 2017-10-31 DIAGNOSIS — J101 Influenza due to other identified influenza virus with other respiratory manifestations: Secondary | ICD-10-CM | POA: Diagnosis not present

## 2017-10-31 DIAGNOSIS — J028 Acute pharyngitis due to other specified organisms: Secondary | ICD-10-CM | POA: Diagnosis not present

## 2017-11-05 DIAGNOSIS — F901 Attention-deficit hyperactivity disorder, predominantly hyperactive type: Secondary | ICD-10-CM | POA: Diagnosis not present

## 2017-12-03 DIAGNOSIS — F901 Attention-deficit hyperactivity disorder, predominantly hyperactive type: Secondary | ICD-10-CM | POA: Diagnosis not present

## 2017-12-23 DIAGNOSIS — F901 Attention-deficit hyperactivity disorder, predominantly hyperactive type: Secondary | ICD-10-CM | POA: Diagnosis not present

## 2018-01-20 DIAGNOSIS — F901 Attention-deficit hyperactivity disorder, predominantly hyperactive type: Secondary | ICD-10-CM | POA: Diagnosis not present

## 2018-02-15 DIAGNOSIS — F901 Attention-deficit hyperactivity disorder, predominantly hyperactive type: Secondary | ICD-10-CM | POA: Diagnosis not present

## 2018-03-04 DIAGNOSIS — F901 Attention-deficit hyperactivity disorder, predominantly hyperactive type: Secondary | ICD-10-CM | POA: Diagnosis not present

## 2018-03-25 DIAGNOSIS — F901 Attention-deficit hyperactivity disorder, predominantly hyperactive type: Secondary | ICD-10-CM | POA: Diagnosis not present

## 2018-04-22 DIAGNOSIS — F901 Attention-deficit hyperactivity disorder, predominantly hyperactive type: Secondary | ICD-10-CM | POA: Diagnosis not present

## 2018-05-20 DIAGNOSIS — F901 Attention-deficit hyperactivity disorder, predominantly hyperactive type: Secondary | ICD-10-CM | POA: Diagnosis not present

## 2018-05-27 DIAGNOSIS — F901 Attention-deficit hyperactivity disorder, predominantly hyperactive type: Secondary | ICD-10-CM | POA: Diagnosis not present

## 2018-06-01 DIAGNOSIS — F901 Attention-deficit hyperactivity disorder, predominantly hyperactive type: Secondary | ICD-10-CM | POA: Diagnosis not present

## 2018-06-07 DIAGNOSIS — R531 Weakness: Secondary | ICD-10-CM | POA: Diagnosis not present

## 2018-06-07 DIAGNOSIS — H538 Other visual disturbances: Secondary | ICD-10-CM | POA: Diagnosis not present

## 2018-06-07 DIAGNOSIS — M62838 Other muscle spasm: Secondary | ICD-10-CM | POA: Diagnosis not present

## 2018-06-07 DIAGNOSIS — F439 Reaction to severe stress, unspecified: Secondary | ICD-10-CM | POA: Diagnosis not present

## 2018-06-07 DIAGNOSIS — R42 Dizziness and giddiness: Secondary | ICD-10-CM | POA: Diagnosis not present

## 2018-06-10 DIAGNOSIS — F901 Attention-deficit hyperactivity disorder, predominantly hyperactive type: Secondary | ICD-10-CM | POA: Diagnosis not present

## 2018-06-17 DIAGNOSIS — H538 Other visual disturbances: Secondary | ICD-10-CM | POA: Diagnosis not present

## 2018-06-17 DIAGNOSIS — F439 Reaction to severe stress, unspecified: Secondary | ICD-10-CM | POA: Diagnosis not present

## 2018-06-17 DIAGNOSIS — R42 Dizziness and giddiness: Secondary | ICD-10-CM | POA: Diagnosis not present

## 2018-06-17 DIAGNOSIS — R531 Weakness: Secondary | ICD-10-CM | POA: Diagnosis not present

## 2018-06-17 DIAGNOSIS — M62838 Other muscle spasm: Secondary | ICD-10-CM | POA: Diagnosis not present

## 2018-06-17 DIAGNOSIS — F901 Attention-deficit hyperactivity disorder, predominantly hyperactive type: Secondary | ICD-10-CM | POA: Diagnosis not present

## 2018-06-24 DIAGNOSIS — F901 Attention-deficit hyperactivity disorder, predominantly hyperactive type: Secondary | ICD-10-CM | POA: Diagnosis not present

## 2018-06-30 DIAGNOSIS — F901 Attention-deficit hyperactivity disorder, predominantly hyperactive type: Secondary | ICD-10-CM | POA: Diagnosis not present

## 2018-07-08 DIAGNOSIS — F901 Attention-deficit hyperactivity disorder, predominantly hyperactive type: Secondary | ICD-10-CM | POA: Diagnosis not present

## 2018-07-13 DIAGNOSIS — F901 Attention-deficit hyperactivity disorder, predominantly hyperactive type: Secondary | ICD-10-CM | POA: Diagnosis not present

## 2018-08-03 DIAGNOSIS — F901 Attention-deficit hyperactivity disorder, predominantly hyperactive type: Secondary | ICD-10-CM | POA: Diagnosis not present

## 2018-08-12 DIAGNOSIS — F901 Attention-deficit hyperactivity disorder, predominantly hyperactive type: Secondary | ICD-10-CM | POA: Diagnosis not present

## 2018-08-23 DIAGNOSIS — F901 Attention-deficit hyperactivity disorder, predominantly hyperactive type: Secondary | ICD-10-CM | POA: Diagnosis not present

## 2018-08-23 DIAGNOSIS — R55 Syncope and collapse: Secondary | ICD-10-CM | POA: Diagnosis not present

## 2018-08-23 DIAGNOSIS — E559 Vitamin D deficiency, unspecified: Secondary | ICD-10-CM | POA: Diagnosis not present

## 2018-08-23 DIAGNOSIS — R42 Dizziness and giddiness: Secondary | ICD-10-CM | POA: Diagnosis not present

## 2018-09-16 DIAGNOSIS — F901 Attention-deficit hyperactivity disorder, predominantly hyperactive type: Secondary | ICD-10-CM | POA: Diagnosis not present

## 2018-09-23 DIAGNOSIS — F901 Attention-deficit hyperactivity disorder, predominantly hyperactive type: Secondary | ICD-10-CM | POA: Diagnosis not present

## 2018-09-28 DIAGNOSIS — F901 Attention-deficit hyperactivity disorder, predominantly hyperactive type: Secondary | ICD-10-CM | POA: Diagnosis not present

## 2018-10-01 ENCOUNTER — Ambulatory Visit (INDEPENDENT_AMBULATORY_CARE_PROVIDER_SITE_OTHER): Payer: Self-pay | Admitting: Family

## 2018-10-01 VITALS — BP 118/88 | HR 97 | Temp 98.2°F | Resp 18 | Ht 69.0 in | Wt 149.0 lb

## 2018-10-01 DIAGNOSIS — G4483 Primary cough headache: Secondary | ICD-10-CM

## 2018-10-01 DIAGNOSIS — J029 Acute pharyngitis, unspecified: Secondary | ICD-10-CM

## 2018-10-01 DIAGNOSIS — R509 Fever, unspecified: Secondary | ICD-10-CM

## 2018-10-01 LAB — POCT INFLUENZA A/B
INFLUENZA B, POC: NEGATIVE
Influenza A, POC: NEGATIVE

## 2018-10-01 LAB — POCT RAPID STREP A (OFFICE): Rapid Strep A Screen: NEGATIVE

## 2018-10-01 MED ORDER — PROMETHAZINE-DM 6.25-15 MG/5ML PO SYRP: 5.0000 mL | ORAL_SOLUTION | Freq: Four times a day (QID) | ORAL | 0 refills | Status: DC | PRN

## 2018-10-01 MED FILL — PROMETHAZINE W/DM SYRUP: 6.25-15 | 6 days supply | Qty: 118 | Fill #0

## 2018-10-01 NOTE — Progress Notes (Signed)
Subjective:     Patient ID: Patrick Burnett, male   DOB: 18-Dec-1995, 22 y.o.   MRN: [id number]  HPI 22 year old male is in today with c/o cough, sore throat, nasal congestion, and cough that started yesterday. He has not taken any medication.    Review of Systems  Constitutional: Negative.   HENT: Positive for congestion, postnasal drip, rhinorrhea and sore throat.   Respiratory: Positive for cough.   Cardiovascular: Negative.   Musculoskeletal: Negative.   Skin: Negative.   Allergic/Immunologic: Negative.   Neurological: Negative.   Psychiatric/Behavioral: Negative.    Past Medical History:  Diagnosis Date  . Attention deficit hyperactivity disorder (ADHD)   . Mood disorder [facility])     Social History   Socioeconomic History  . Marital status: Single    Spouse name: Not on file  . Number of children: 0  . Years of education: Not on file  . Highest education level: Not on file  Occupational History  . Occupation: Pensions consultant: Designer, [employer]  Social Needs  . Financial resource strain: Not on file  . Food insecurity:    Worry: Not on file    Inability: Not on file  . Transportation needs:    Medical: Not on file    Non-medical: Not on file  Tobacco Use  . Smoking status: Never Smoker  . Smokeless tobacco: Never Used  Substance and Sexual Activity  . Alcohol use: No  . Drug use: No  . Sexual activity: Not on file  Lifestyle  . Physical activity:    Days per week: Not on file    Minutes per session: Not on file  . Stress: Not on file  Relationships  . Social connections:    Talks on phone: Not on file    Gets together: Not on file    Attends religious service: Not on file    Active member of club or organization: Not on file    Attends meetings of clubs or organizations: Not on file    Relationship status: Not on file  . Intimate partner violence:    Fear of current or ex partner: Not on file    Emotionally abused: Not on file    Physically abused:  Not on file    Forced sexual activity: Not on file  Other Topics Concern  . Not on file  Social History Narrative  . Not on file    Past Surgical History:  Procedure Laterality Date  . none      Family History  Problem Relation Age of Onset  . Hypercholesterolemia Mother   . Anxiety disorder Mother   . Sleep apnea Mother   . Diabetes Father   . Hypercholesterolemia Father   . Anxiety disorder Father   . Colon cancer Paternal Grandfather   . Stomach cancer Neg Hx   . Esophageal cancer Neg Hx   . Rectal cancer Neg Hx   . Liver cancer Neg Hx     Allergies  Allergen Reactions  . Amoxicillin Nausea And Vomiting  . Risperidone And Related     Current Outpatient Medications on File Prior to Visit  Medication Sig Dispense Refill  . ALPRAZolam (XANAX) 0.25 MG tablet Take 0.25 mg by mouth as needed for anxiety.    Marland Kitchen loratadine (CLARITIN) 10 MG tablet Take 10 mg by mouth daily.    . fluticasone (FLONASE) 50 MCG/ACT nasal spray Place into both nostrils daily.     No current  facility-administered medications on file prior to visit.     BP 118/88 (BP Location: Right Arm, Patient Position: Sitting, Cuff Size: Normal)   Pulse 97   Temp 98.2 F (36.8 C) (Oral)   Resp 18   Ht 5\' 9"  (1.753 m)   Wt 149 lb (67.6 kg)   BMI 22.00 kg/m chart    Objective:   Physical Exam  Constitutional: He is oriented to person, place, and time. He appears well-developed and well-nourished.  HENT:  Right Ear: Tympanic membrane and ear canal normal.  Left Ear: Tympanic membrane and ear canal normal.  Mouth/Throat: Mucous membranes are normal. Posterior oropharyngeal edema and posterior oropharyngeal erythema present. No tonsillar abscesses.  Cardiovascular: Normal rate, regular rhythm and normal heart sounds.  Pulmonary/Chest: Effort normal and breath sounds normal.  Neurological: He is alert and oriented to person, place, and time.  Skin: Skin is warm and dry.  Psychiatric: He has a normal  mood and affect.       Assessment:         Plan:     Call the office if symptoms worsen or persist. Recheck as needed.

## 2018-10-01 NOTE — Patient Instructions (Signed)
Upper Respiratory Infection, Adult Most upper respiratory infections (URIs) are a viral infection of the air passages leading to the lungs. A URI affects the nose, throat, and upper air passages. The most common type of URI is nasopharyngitis and is typically referred to as "the common cold." URIs run their course and usually go away on their own. Most of the time, a URI does not require medical attention, but sometimes a bacterial infection in the upper airways can follow a viral infection. This is called a secondary infection. Sinus and middle ear infections are common types of secondary upper respiratory infections. Bacterial pneumonia can also complicate a URI. A URI can worsen asthma and chronic obstructive pulmonary disease (COPD). Sometimes, these complications can require emergency medical care and may be life threatening. What are the causes? Almost all URIs are caused by viruses. A virus is a type of germ and can spread from one person to another. What increases the risk? You may be at risk for a URI if:  You smoke.  You have chronic heart or lung disease.  You have a weakened defense (immune) system.  You are very young or very old.  You have nasal allergies or asthma.  You work in crowded or poorly ventilated areas.  You work in health care facilities or schools.  What are the signs or symptoms? Symptoms typically develop 2-3 days after you come in contact with a cold virus. Most viral URIs last 7-10 days. However, viral URIs from the influenza virus (flu virus) can last 14-18 days and are typically more severe. Symptoms may include:  Runny or stuffy (congested) nose.  Sneezing.  Cough.  Sore throat.  Headache.  Fatigue.  Fever.  Loss of appetite.  Pain in your forehead, behind your eyes, and over your cheekbones (sinus pain).  Muscle aches.  How is this diagnosed? Your health care provider may diagnose a URI by:  Physical exam.  Tests to check that your  symptoms are not due to another condition such as: ? Strep throat. ? Sinusitis. ? Pneumonia. ? Asthma.  How is this treated? A URI goes away on its own with time. It cannot be cured with medicines, but medicines may be prescribed or recommended to relieve symptoms. Medicines may help:  Reduce your fever.  Reduce your cough.  Relieve nasal congestion.  Follow these instructions at home:  Take medicines only as directed by your health care provider.  Gargle warm saltwater or take cough drops to comfort your throat as directed by your health care provider.  Use a warm mist humidifier or inhale steam from a shower to increase air moisture. This may make it easier to breathe.  Drink enough fluid to keep your urine clear or pale yellow.  Eat soups and other clear broths and maintain good nutrition.  Rest as needed.  Return to work when your temperature has returned to normal or as your health care provider advises. You may need to stay home longer to avoid infecting others. You can also use a face mask and careful hand washing to prevent spread of the virus.  Increase the usage of your inhaler if you have asthma.  Do not use any tobacco products, including cigarettes, chewing tobacco, or electronic cigarettes. If you need help quitting, ask your health care provider. How is this prevented? The best way to protect yourself from getting a cold is to practice good hygiene.  Avoid oral or hand contact with people with cold symptoms.  Wash your   hands often if contact occurs.  There is no clear evidence that vitamin C, vitamin E, echinacea, or exercise reduces the chance of developing a cold. However, it is always recommended to get plenty of rest, exercise, and practice good nutrition. Contact a health care provider if:  You are getting worse rather than better.  Your symptoms are not controlled by medicine.  You have chills.  You have worsening shortness of breath.  You have  brown or red mucus.  You have yellow or brown nasal discharge.  You have pain in your face, especially when you bend forward.  You have a fever.  You have swollen neck glands.  You have pain while swallowing.  You have white areas in the back of your throat. Get help right away if:  You have severe or persistent: ? Headache. ? Ear pain. ? Sinus pain. ? Chest pain.  You have chronic lung disease and any of the following: ? Wheezing. ? Prolonged cough. ? Coughing up blood. ? A change in your usual mucus.  You have a stiff neck.  You have changes in your: ? Vision. ? Hearing. ? Thinking. ? Mood. This information is not intended to replace advice given to you by your health care provider. Make sure you discuss any questions you have with your health care provider. Document Released: 04/15/2001 Document Revised: 06/22/2016 Document Reviewed: 01/25/2014 Elsevier Interactive Patient Education  2018 Elsevier Inc.  

## 2018-10-05 DIAGNOSIS — R062 Wheezing: Secondary | ICD-10-CM | POA: Diagnosis not present

## 2018-10-05 DIAGNOSIS — J320 Chronic maxillary sinusitis: Secondary | ICD-10-CM | POA: Diagnosis not present

## 2018-10-05 DIAGNOSIS — R05 Cough: Secondary | ICD-10-CM | POA: Diagnosis not present

## 2018-10-05 MED FILL — BENZONATATE 200 MG CAPS: 200 | 7 days supply | Qty: 21 | Fill #0

## 2018-10-05 MED FILL — VENTOLIN HFA 90 MCG INHALER: 108 (90 BAS | 25 days supply | Qty: 18 | Fill #0

## 2018-10-05 MED FILL — DOXYCYCLINE HYCLATE 100 MG: 100 | 10 days supply | Qty: 20 | Fill #0

## 2018-10-14 DIAGNOSIS — F901 Attention-deficit hyperactivity disorder, predominantly hyperactive type: Secondary | ICD-10-CM | POA: Diagnosis not present

## 2018-10-19 DIAGNOSIS — H1045 Other chronic allergic conjunctivitis: Secondary | ICD-10-CM | POA: Diagnosis not present

## 2018-10-19 DIAGNOSIS — J301 Allergic rhinitis due to pollen: Secondary | ICD-10-CM | POA: Diagnosis not present

## 2018-10-19 DIAGNOSIS — J3089 Other allergic rhinitis: Secondary | ICD-10-CM | POA: Diagnosis not present

## 2018-10-19 DIAGNOSIS — J3081 Allergic rhinitis due to animal (cat) (dog) hair and dander: Secondary | ICD-10-CM | POA: Diagnosis not present

## 2018-10-21 DIAGNOSIS — F901 Attention-deficit hyperactivity disorder, predominantly hyperactive type: Secondary | ICD-10-CM | POA: Diagnosis not present

## 2018-10-29 DIAGNOSIS — J209 Acute bronchitis, unspecified: Secondary | ICD-10-CM | POA: Diagnosis not present

## 2018-11-01 MED FILL — DOXYCYCLINE HYCLATE 100 MG: 100 | 10 days supply | Qty: 20 | Fill #0

## 2018-11-05 DIAGNOSIS — F901 Attention-deficit hyperactivity disorder, predominantly hyperactive type: Secondary | ICD-10-CM | POA: Diagnosis not present

## 2018-11-18 DIAGNOSIS — F901 Attention-deficit hyperactivity disorder, predominantly hyperactive type: Secondary | ICD-10-CM | POA: Diagnosis not present

## 2018-12-01 DIAGNOSIS — F901 Attention-deficit hyperactivity disorder, predominantly hyperactive type: Secondary | ICD-10-CM | POA: Diagnosis not present

## 2018-12-08 DIAGNOSIS — F901 Attention-deficit hyperactivity disorder, predominantly hyperactive type: Secondary | ICD-10-CM | POA: Diagnosis not present

## 2018-12-09 ENCOUNTER — Ambulatory Visit: Payer: 59 | Admitting: Psychiatry

## 2018-12-09 ENCOUNTER — Telehealth: Payer: Self-pay | Admitting: Psychiatry

## 2018-12-09 ENCOUNTER — Encounter: Payer: Self-pay | Admitting: Psychiatry

## 2018-12-09 VITALS — BP 108/72 | HR 76 | Ht 68.0 in | Wt 148.0 lb

## 2018-12-09 DIAGNOSIS — F902 Attention-deficit hyperactivity disorder, combined type: Secondary | ICD-10-CM

## 2018-12-09 DIAGNOSIS — F411 Generalized anxiety disorder: Secondary | ICD-10-CM

## 2018-12-09 DIAGNOSIS — F321 Major depressive disorder, single episode, moderate: Secondary | ICD-10-CM

## 2018-12-09 MED ORDER — MIRTAZAPINE 7.5 MG PO TABS: 7.5000 mg | ORAL_TABLET | Freq: Every day | ORAL | 1 refills | Status: DC

## 2018-12-09 MED FILL — MIRTAZAPINE 7.5 MG TABLET: 7.5 | 30 days supply | Qty: 30 | Fill #0

## 2018-12-09 NOTE — Telephone Encounter (Signed)
Pt needs a generic letter for work mailed to his home address. He was seen by Dr Marlyne Beards today.
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
## 2018-12-09 NOTE — Telephone Encounter (Signed)
Mother calls requirement and request by patient to receive a letter for work excuse sent to his home address relative to appointment today documenting missed days expected.

## 2018-12-13 ENCOUNTER — Telehealth: Payer: Self-pay | Admitting: Psychiatry

## 2018-12-13 DIAGNOSIS — F411 Generalized anxiety disorder: Secondary | ICD-10-CM

## 2018-12-13 DIAGNOSIS — F321 Major depressive disorder, single episode, moderate: Secondary | ICD-10-CM

## 2018-12-13 NOTE — Telephone Encounter (Signed)
Patrick Burnett phones for me to call his cell phone about his intolerance of mirtazapine even at 3.75 mg nightly because he is sluggish and crashes the next day.  I cannot get an answer from him on his cell phone to discuss options, though his mother attended the appointment and I leave a message for Patrick Burnett as no answer on [name]'s home phone number except her message that we could consider Luvox 100 mg nightly instead in place of the mirtazapine.  I await the response of either or can try them again tomorrow about options.

## 2018-12-13 NOTE — Telephone Encounter (Signed)
Please review message

## 2018-12-13 NOTE — Telephone Encounter (Signed)
Patient called and said that the mirtazipine the lowest dose he is having a problem with it .  He can't focus at all is very sluggish. He also crashes the next day . Please call him to talk about the problem. [phone]

## 2018-12-14 ENCOUNTER — Telehealth: Payer: Self-pay | Admitting: Psychiatry

## 2018-12-14 DIAGNOSIS — F331 Major depressive disorder, recurrent, moderate: Secondary | ICD-10-CM

## 2018-12-14 MED ORDER — FLUVOXAMINE MALEATE 50 MG PO TABS: 50.0000 mg | ORAL_TABLET | Freq: Every day | ORAL | 0 refills | Status: DC

## 2018-12-14 MED FILL — FLUVOXAMINE MALEATE 50 MG T: 50 | 30 days supply | Qty: 30 | Fill #0

## 2018-12-14 NOTE — Telephone Encounter (Signed)
Patient does phone back that he has every side effect known to mirtazapine and must stop it.  He ask about tapering or discontinuation symptoms of which I assure him that he should stop it immediately to discontinue the side effects and need not worry about discontinuation with dose being too low to taper.  We process replacement by fluvoxamine and will use the 50 instead 100 mg tablet due to his side effects on mirtazapine.  Luvox 50 mg every bedtime #30 with no refill is sent to [facility] outpatient pharmacy.

## 2018-12-14 NOTE — Telephone Encounter (Signed)
PATIENT IS CURRENTLY TAKING WANTS  TO BE TAKEN OFF THE MERTAZAPINE

## 2018-12-14 NOTE — Addendum Note (Signed)
Addended by: Beverly Milch E on: 12/14/2018 01:09 PM   Modules accepted: Orders

## 2018-12-15 DIAGNOSIS — F901 Attention-deficit hyperactivity disorder, predominantly hyperactive type: Secondary | ICD-10-CM | POA: Diagnosis not present

## 2018-12-20 ENCOUNTER — Telehealth: Payer: Self-pay | Admitting: Psychiatry

## 2018-12-20 MED ORDER — GABAPENTIN 300 MG PO CAPS: 300.0000 mg | ORAL_CAPSULE | Freq: Every day | ORAL | 0 refills | Status: DC

## 2018-12-20 NOTE — Addendum Note (Signed)
Addended by: Chauncey Mann on: 12/20/2018 05:48 PM   Modules accepted: Orders

## 2018-12-20 NOTE — Telephone Encounter (Signed)
Patient is feeling disassociated not able to focus, having panic attacks patient currently taking fluvoxamine, patient cannot sleep he thinks it's after affects from fluvoxamine would like something to help him sleep.  Please advise

## 2018-12-20 NOTE — Telephone Encounter (Signed)
Patient leaves message that he is unable to sleep therefore missing work today feeling fuzzy minded as though putting a key in the lock is difficult.  When I returned call, mother clarifies that patient tends to overstate symptoms not liking medications but she thinks he needs to keep taking the Luvox for panic even though he blames Luvox for other symptoms he described.  However they would benefit from medication to help him sleep to be able to attend work and will send gabapentin 300 mg nightly #30 with no refill to [facility] outpatient pharmacy with education to mother on proper use and monitoring. She is certain that he does not use street drugs or alcohol, and any Xanax would be only for panic attacks left over from October a year ago prescription.

## 2018-12-20 NOTE — Telephone Encounter (Signed)
Note    Addended by: Chauncey Mann on: 12/20/2018 05:48 PM   Modules accepted: Orders     Me       5:48 PM  Note    Patient leaves message that he is unable to sleep therefore missing work today feeling fuzzy minded as though putting a key in the lock is difficult.  When I returned call, mother clarifies that patient tends to overstate symptoms not liking medications but she thinks he needs to keep taking the Luvox for panic even though he blames Luvox for other symptoms he described.  However they would benefit from medication to help him sleep to be able to attend work and will send gabapentin 300 mg nightly #30 with no refill to [facility] outpatient pharmacy with education to mother on proper use and monitoring. She is certain that he does not use street drugs or alcohol, and any Xanax would be only for panic attacks left over from October a year ago prescription.     Note    Addended by: Chauncey Mann on: 12/20/2018 05:48 PM   Modules accepted: Orders     Me       5:48 PM  Note    Patient leaves message that he is unable to sleep therefore missing work today feeling fuzzy minded as though putting a key in the lock is difficult.  When I returned call, mother clarifies that patient tends to overstate symptoms not liking medications but she thinks he needs to keep taking the Luvox for panic even though he blames Luvox for other symptoms he described.  However they would benefit from medication to help him sleep to be able to attend work and will send gabapentin 300 mg nightly #30 with no refill to [facility] outpatient pharmacy with education to mother on proper use and monitoring. She is certain that he does not use street drugs or alcohol, and any Xanax would be only for panic attacks left over from October a year ago prescription.

## 2018-12-20 NOTE — Telephone Encounter (Signed)
See previous phone messages  

## 2018-12-21 ENCOUNTER — Ambulatory Visit: Payer: 59 | Admitting: Psychiatry

## 2018-12-21 ENCOUNTER — Encounter: Payer: Self-pay | Admitting: Psychiatry

## 2018-12-21 VITALS — BP 106/78 | HR 76 | Ht 67.0 in | Wt 155.0 lb

## 2018-12-21 DIAGNOSIS — F481 Depersonalization-derealization syndrome: Secondary | ICD-10-CM | POA: Insufficient documentation

## 2018-12-21 DIAGNOSIS — F902 Attention-deficit hyperactivity disorder, combined type: Secondary | ICD-10-CM

## 2018-12-21 DIAGNOSIS — F331 Major depressive disorder, recurrent, moderate: Secondary | ICD-10-CM

## 2018-12-21 DIAGNOSIS — F411 Generalized anxiety disorder: Secondary | ICD-10-CM | POA: Insufficient documentation

## 2018-12-21 MED ORDER — ESCITALOPRAM OXALATE 5 MG PO TABS: 2.5000 mg | ORAL_TABLET | Freq: Every day | ORAL | 0 refills | Status: DC

## 2018-12-21 MED ORDER — CLONAZEPAM 0.5 MG PO TABS: 0.2500 mg | ORAL_TABLET | Freq: Two times a day (BID) | ORAL | 0 refills | Status: DC

## 2018-12-21 MED FILL — clonazePAM 0.5 MG TABS: 0.5 | 30 days supply | Qty: 30 | Fill #0

## 2018-12-21 MED FILL — ESCITALOPRAM 5 MG TABLET: 5 | 30 days supply | Qty: 15 | Fill #0

## 2018-12-21 MED FILL — GABAPENTIN 300 MG CAPSULE: 300 | 30 days supply | Qty: 30 | Fill #0

## 2018-12-21 NOTE — Progress Notes (Signed)
[facility]  Patient ID: Patrick Burnett,  MRN: [id number]  PCP: Catha GosselinLittle, Kevin, MD  Date of Evaluation: 12/21/2018 Time spent:20 minutes  Chief Complaint:  Chief Complaint    Anxiety; Depression; Memory Loss; Stress; Trauma      HISTORY/CURRENT STATUS: Patrick Burnett is seen conjointly with mother face-to-face with consent not collateral psychiatric interview and exam in 2-week evaluation and management depression and anxiety locating legal proceedings also consequence of such having learning variations and more evident cluster B traits.  He does not explain his distortion 16 months ago of proceedings at school representing them as academic rather than social behavioral disciplinary that are now culminating in possibly a civil court appearance expected for charges from a previous girlfriend.  Patient disengaged from [employer] prior to graduation and continues his employment though now missing work several times stating at 1 moment that he cannot attend work and that the next that he will be there tomorrow.  He did not pick up gabapentin yet from the pharmacy discussed with mother yesterday for sleep facilitation and dissociative symptoms he attributes to Luvox at least partially.  Patient today states that maybe he is not depressed at all as Dr. Ellery PlunkHedding his therapist was concerned would hospitalize him soon, but rather that he is just anxious.  He describes unusual fragmented thinking and somatic sensations of depersonalization derealization, more than ever needs appropriate sleep and facilitation of emotional capacity to function on the job to become a template by which to work with his attorney in court. He desbcribes his memory being off and his inability to feel cold and pain sensations then exhibits the capacity for both in the session.  He has confidence from his research with family and friends that low-dose Lexapro can be tolerated.  DMDD initially suspected at his first appointment 3-1/2  years ago is now more major depression in his psychotherapy, and father has bipolar and OCD.  Depression       The patient presents with depression.  This is a recurrent problem.  The current episode started more than 1 month ago.   The onset quality is gradual.   The problem occurs daily.  The problem has been gradually worsening since onset.  Associated symptoms include decreased concentration, fatigue, helplessness, hopelessness, insomnia, irritable, restlessness, decreased interest and sad.  Associated symptoms include no suicidal ideas.     The symptoms are aggravated by work stress, social issues, medication and family issues.  Past treatments include SSRIs - Selective serotonin reuptake inhibitors, other medications and psychotherapy.  Compliance with treatment is poor.  Past compliance problems include difficulty with treatment plan, medication issues and medical issues.  Previous treatment provided no relief relief.  Risk factors include a change in medication usage/dosage, emotional abuse, family history of mental illness, history of mental illness, family history, major life event, stress and the patient not taking medications correctly.   Past medical history includes anxiety, depression and mental health disorder.     Pertinent negatives include no life-threatening condition, no physical disability, no bipolar disorder, no eating disorder, no obsessive-compulsive disorder, no post-traumatic stress disorder, no schizophrenia, no suicide attempts and no head trauma.   Individual Medical History/ Review of Systems: Changes? :Yes whether from stress and/or attempts at treatment, has become more symptomatic rather than improved 2 weeks.  He has phoned at least 3 times only once answering his return call being at work then so that mother generally answers for him about continued inability to sleep at night and  be optimally effective on the job. He is more specific about depersonalization and  derealization somatic and cognitive symptoms whether primary from the stress he perceives about upcoming court or as he also formulates possibly from Remeron then Luvox.  His own research has identified confidence and comfort and low-dose not pliant with his Dextrostat as of last appointment in the office 16 months ago before current decompensation is disapproved of Risperdal and Prozac in the past and many stimulants which have been considered optimal or helpful.  Xanax has been the only helpful medication still having a few left from last October so that mother gave him a half of a Xanax 0.5 mg for high anxiety this week with partial relief but he slept for 2 hours.  Allergies: Amoxicillin; Penicillins; and Risperidone and related  Current Medications:  Current Outpatient Medications:  .  clonazePAM (KLONOPIN) 0.5 MG tablet, Take 0.5 tablets (0.25 mg total) by mouth 2 (two) times daily., Disp: 30 tablet, Rfl: 0 .  escitalopram (LEXAPRO) 5 MG tablet, Take 0.5 tablets (2.5 mg total) by mouth at bedtime., Disp: 15 tablet, Rfl: 0 .  fluticasone (FLONASE) 50 MCG/ACT nasal spray, Place into both nostrils daily., Disp: , Rfl:  .  gabapentin (NEURONTIN) 300 MG capsule, Take 1 capsule (300 mg total) by mouth at bedtime., Disp: 30 capsule, Rfl: 0 .  loratadine (CLARITIN) 10 MG tablet, Take 10 mg by mouth daily., Disp: , Rfl:  .  promethazine-dextromethorphan (PROMETHAZINE-DM) 6.25-15 MG/5ML syrup, Take 5 mLs by mouth 4 (four) times daily as needed for cough., Disp: 118 mL, Rfl: 0   Medication Side Effects: anxiety, fatigue/weakness and Dissociative depersonalization derealization symptoms possibly from Luvox  Family Medical/ Social History: Changes? Yes expecting to be off work 1 minute and to return to work tomorrow the next having provided his professional statement for work excuse to his employer that he required mailed to his residence now asking for 2 more work excuses today as he missed work yesterday  and today.  His current participation in treatment is of optimal for change when he must most importantly facilitate his lawyer's representation of him but disorganizes and confuses interpersonal communication and collaboration with cluster B traits recognized in the past complicating learning variances.  Patient and mother request anxiety and dissociation stabilization with clonazepam  father takes successfully though patient did not tolerate the mirtazapine that helps father.  MENTAL HEALTH EXAM: Muscle strengths and tone 5/5, postural reflexes and gait 0/0, and AIMS = 0.  He has no tremor or increased tone.  Sensory exam is intact he spontaneously describes not feeling cold or pain sensation such as in his hands. Blood pressure 106/78, pulse 76, height 5\' 7"  (1.702 m), weight 155 lb (70.3 kg).Body mass index is 24.28 kg/m.  General Appearance: Casual, Disheveled and Guarded  Eye Contact:  Fair  Speech:  Clear and Coherent and Talkative  Volume:  Normal  Mood:  Anxious, Depressed, Dysphoric, Irritable and Worthless  Affect:  Depressed, Labile, Full Range and Anxious  Thought Process:  Disorganized and Linear  Orientation:  Full (Time, Place, and Person)  Thought Content: Illogical, Ilusions, Obsessions, Paranoid Ideation and Rumination   Suicidal Thoughts:  No  Homicidal Thoughts:  No  Memory:  Immediate;   Fair Remote;   Good  Judgement:  Impaired  Insight:  Fair and Lacking  Psychomotor Activity:  Increased and Decreased  Concentration:  Concentration: Fair and Attention Span: Fair  Recall:  [employer] of Knowledge: Good  Language:  Good  Assets:  Physical Health Resilience Talents/Skills  ADL's:  Intact  Cognition: WNL  Prognosis:  Fair    DIAGNOSES:    ICD-10-CM   1. Moderate recurrent major depression (HCC) F33.1 escitalopram (LEXAPRO) 5 MG tablet  2. Generalized anxiety disorder F41.1 escitalopram (LEXAPRO) 5 MG tablet    clonazePAM (KLONOPIN) 0.5 MG tablet  3. Attention  deficit hyperactivity disorder (ADHD), combined type, moderate F90.2   4. Depersonalization-derealization disorder (HCC) F48.1     Receiving Psychotherapy: Yes    RECOMMENDATIONS: Formulations are not really changed for the patient's confusing report of changing symptoms but gets are clarified including at least relative dissociation may be partially cluster B traits in a very stressful life circumstance.  Patient's random request about work and court are consolidated to the expectation that he will feel and function better when he achieves at least the basics.  Luvox is discontinued and changed to Lexapro 5 mg tablet to take 1/2 tablet nightly at bedtime #15 sent to [facility] outpatient pharmacy with no refill for depression and anxiety with Wellbutrin or Seroquel possibly the next best option.  Is prescribed Klonopin 0.5 mg to take 1/2 tablet twice daily morning and after work 30 with no refill sent to [facility] outpatient for anxiety and depersonalization.  He has at the pharmacy gabapentin 300 mg nightly #30 with no refill yesterday's prescription for insomnia and dissociation.  He returns in 2 weeks and is provided several forms of work excuse today for the last 2 days but does not use these fully.   Chauncey Mann, MD

## 2018-12-21 NOTE — Patient Instructions (Addendum)
Medical use is given for February 17 and 18 for the medical reasons and treatment provided in the professional doctor statement of February 6th.

## 2018-12-29 DIAGNOSIS — F901 Attention-deficit hyperactivity disorder, predominantly hyperactive type: Secondary | ICD-10-CM | POA: Diagnosis not present

## 2019-01-05 DIAGNOSIS — F901 Attention-deficit hyperactivity disorder, predominantly hyperactive type: Secondary | ICD-10-CM | POA: Diagnosis not present

## 2019-01-10 ENCOUNTER — Ambulatory Visit: Payer: 59 | Admitting: Psychiatry

## 2019-01-10 ENCOUNTER — Encounter: Payer: Self-pay | Admitting: Psychiatry

## 2019-01-10 VITALS — BP 106/68 | HR 74 | Ht 67.0 in | Wt 154.0 lb

## 2019-01-10 DIAGNOSIS — F481 Depersonalization-derealization syndrome: Secondary | ICD-10-CM

## 2019-01-10 DIAGNOSIS — F411 Generalized anxiety disorder: Secondary | ICD-10-CM

## 2019-01-10 DIAGNOSIS — F331 Major depressive disorder, recurrent, moderate: Secondary | ICD-10-CM

## 2019-01-10 DIAGNOSIS — F321 Major depressive disorder, single episode, moderate: Secondary | ICD-10-CM

## 2019-01-10 DIAGNOSIS — F902 Attention-deficit hyperactivity disorder, combined type: Secondary | ICD-10-CM

## 2019-01-10 MED ORDER — CLONAZEPAM 0.5 MG PO TABS: 0.2500 mg | ORAL_TABLET | Freq: Two times a day (BID) | ORAL | 1 refills | Status: DC

## 2019-01-10 MED ORDER — GABAPENTIN 300 MG PO CAPS: 300.0000 mg | ORAL_CAPSULE | Freq: Every day | ORAL | 1 refills | Status: DC

## 2019-01-10 MED ORDER — ESCITALOPRAM OXALATE 5 MG PO TABS: 2.5000 mg | ORAL_TABLET | Freq: Every day | ORAL | 1 refills | Status: DC

## 2019-01-10 NOTE — Progress Notes (Signed)
[facility]  Patient ID: Patrick Burnett,  MRN: [id number]  PCP: Catha Gosselin, MD  Date of Evaluation: 01/10/2019 Time spent:20 minutes  Chief Complaint:  Chief Complaint    Depression; Anxiety; ADHD; Stress      HISTORY/CURRENT STATUS: Patrick Burnett is seen individually face-to-face with consent not collateral mother not attending today for psychiatric interview and exam in 3-week evaluation and management of major depression, generalized anxiety, ADHD, and depersonalization.  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 He sleeps better overall on the regimen of gabapentin, esctalopram, and clonazepam with some morning drowsiness interference.  He is having no dissociation now and as at last appointment reports depression is improved and now at this appointment anxiety is improved.  He saw Dr. Ellery Plunk last for weekly appointment 01/06/2019.  He did wake with a sore throat and hoarseness this morning present for 9 hours so that he may skip half of the workday today.  He has no psychosis, mania or suicide risk.  Depression       The patient presents with depression.  This is a recurrent problem.  The current episode started more than 1 month ago.   The onset quality is sudden.   The problem occurs intermittently.  The problem has been  gradually improving since onset.  Associated symptoms include decreased concentration, fatigue, insomnia, decreased interest and headaches.  Associated symptoms include no helplessness, no hopelessness, not irritable and no body aches.     The symptoms are aggravated by work stress, social issues, family issues and medication.  Past treatments include SSRIs - Selective serotonin reuptake inhibitors, other medications and psychotherapy.  Compliance with treatment is good.  Past compliance problems include difficulty with treatment plan, medication issues and medical issues.  Previous treatment provided moderate relief.  Risk factors include alcohol intake, a change in medication usage/dosage, family history of mental illness, history of mental illness, family history, history of self-injury, major life event, substance abuse, prior traumatic experience and stress.   Past medical history includes depression, mental health disorder and obsessive-compulsive disorder.     Pertinent negatives include no chronic illness, no physical disability, no terminal illness, no recent psychiatric admission, no bipolar disorder, no eating disorder, no post-traumatic stress disorder, no schizophrenia, no suicide attempts and no head trauma.   Individual Medical History/ Review of Systems: Changes? :Yes Sore throat and hoarseness from earlier this morning may prevent work today.  Past congenital hearing loss left ear with allergic rhinitis and headache suggest some vulnerability to such infection.  Weight is restored by next pounds which is what he had lost to begin with.  Allergies: Amoxicillin; Penicillins; and Risperidone and related  Current Medications:  Current Outpatient Medications:  .  clonazePAM (KLONOPIN) 0.5 MG tablet, Take 0.5 tablets (0.25 mg total) by mouth 2 (two) times daily., Disp: 30  tablet, Rfl: 1 .  escitalopram (LEXAPRO) 5 MG tablet, Take 0.5 tablets (2.5 mg total) by mouth at bedtime., Disp: 15 tablet,  Rfl: 1 .  fluticasone (FLONASE) 50 MCG/ACT nasal spray, Place into both nostrils daily., Disp: , Rfl:  .  gabapentin (NEURONTIN) 300 MG capsule, Take 1 capsule (300 mg total) by mouth at bedtime., Disp: 30 capsule, Rfl: 1 .  loratadine (CLARITIN) 10 MG tablet, Take 10 mg by mouth daily., Disp: , Rfl:  .  promethazine-dextromethorphan (PROMETHAZINE-DM) 6.25-15 MG/5ML syrup, Take 5 mLs by mouth 4 (four) times daily as needed for cough., Disp: 118 mL, Rfl: 0   Medication Side Effects: hypersomnolence  Family Medical/ Social History: Changes? Yes but improved sense of wellbeing, function, and full resumption of work, the patient has not collaborated with his attorney to use these current skills to assure optimal representation.  MENTAL HEALTH EXAM: Muscle strengths and tone 5/5, postural reflexes and gait 0/0, and AIMS = 0. Blood pressure 106/68, pulse 74, height 5\' 7"  (1.702 m), weight 154 lb (69.9 kg).Body mass index is 24.12 kg/m.  General Appearance: Casual, Fairly Groomed and Guarded  Eye Contact:  Fair  Speech:  Clear and Coherent, Normal Rate and Talkative  Volume:  Normal  Mood:  Anxious, Dysphoric, Euthymic and Worthless  Affect:  Congruent, Inappropriate, Restricted and Anxious  Thought Process:  Goal Directed and Linear  Orientation:  Full (Time, Place, and Person)  Thought Content: Obsessions, Paranoid Ideation and Rumination   Suicidal Thoughts:  No  Homicidal Thoughts:  No  Memory:  Immediate;   Good Remote;   Good  Judgement:  Fair  Insight:  Fair and Lacking  Psychomotor Activity:  Increased, Decreased and Mannerisms  Concentration:  Concentration: Fair and Attention Span: Poor  Recall:  [employer] of Knowledge: Fair  Language: Fair  Assets:  Desire for Improvement Resilience Talents/Skills  ADL's:  Intact  Cognition: WNL  Prognosis:  Good    DIAGNOSES:    ICD-10-CM   1. Moderate recurrent major depression (HCC) F33.1 escitalopram (LEXAPRO) 5 MG tablet  2.  Generalized anxiety disorder F41.1 clonazePAM (KLONOPIN) 0.5 MG tablet    escitalopram (LEXAPRO) 5 MG tablet    gabapentin (NEURONTIN) 300 MG capsule  3. Attention deficit hyperactivity disorder (ADHD), combined type, moderate F90.2   4. Depersonalization-derealization disorder (HCC) F48.1   5. Moderate major depression, single episode (HCC) F32.1 gabapentin (NEURONTIN) 300 MG capsule    Receiving Psychotherapy: Yes Waneta Martins, PhD   RECOMMENDATIONS: Reinforcement of the improvement recruited intended for generalization to family such as father with OCD and bipolar on Remeron and mother who attempts to support and guide without assuming responsibility as achieved in therapy can then be extended to work and finally court.  Over 50% of the time is spent in such counseling and coordination of care, very difficult to achieve in the 2 previous sessions if at all.  From that perspective, he declines to increase Lexapro most important to recovery among medications, while he assures to maintain his sense of relief by Klonopin and Neurontin with which he can continue to participate in all treatment.  He is E scribed Lexapro 5 mg tablet taking 1/2 tablet total 2.5 mg every bedtime to [facility] outpatient pharmacy for anxiety and depression.  Neurontin is E scribed 100 mg nightly #30 with 1 refill to [facility] for anxiety, dissociation, and insomnia..  Klonopin 0.5 mg tablet taking 1/2 tablet total 0.25 mg twice daily #30 with 1 refill is  E scribed to [facility] for anxiety and dissociation.He returns in 4 weeks.  Chauncey Mann, MD

## 2019-01-12 DIAGNOSIS — J0100 Acute maxillary sinusitis, unspecified: Secondary | ICD-10-CM | POA: Diagnosis not present

## 2019-01-12 DIAGNOSIS — F901 Attention-deficit hyperactivity disorder, predominantly hyperactive type: Secondary | ICD-10-CM | POA: Diagnosis not present

## 2019-01-12 DIAGNOSIS — J029 Acute pharyngitis, unspecified: Secondary | ICD-10-CM | POA: Diagnosis not present

## 2019-01-12 MED FILL — DOXYCYCLINE MONOHYDRATE 100: 100 | 10 days supply | Qty: 20 | Fill #0

## 2019-01-14 MED FILL — GABAPENTIN 300 MG CAPSULE: 300 | 30 days supply | Qty: 30 | Fill #0

## 2019-01-14 MED FILL — ESCITALOPRAM 5 MG TABLET: 5 | 30 days supply | Qty: 15 | Fill #0

## 2019-01-17 MED FILL — clonazePAM 0.5 MG TABS: 0.5 | 30 days supply | Qty: 30 | Fill #0

## 2019-02-02 DIAGNOSIS — F901 Attention-deficit hyperactivity disorder, predominantly hyperactive type: Secondary | ICD-10-CM | POA: Diagnosis not present

## 2019-02-07 ENCOUNTER — Encounter: Payer: Self-pay | Admitting: Psychiatry

## 2019-02-07 ENCOUNTER — Ambulatory Visit (INDEPENDENT_AMBULATORY_CARE_PROVIDER_SITE_OTHER): Payer: 59 | Admitting: Psychiatry

## 2019-02-07 ENCOUNTER — Other Ambulatory Visit: Payer: Self-pay

## 2019-02-07 DIAGNOSIS — F411 Generalized anxiety disorder: Secondary | ICD-10-CM

## 2019-02-07 DIAGNOSIS — F481 Depersonalization-derealization syndrome: Secondary | ICD-10-CM | POA: Diagnosis not present

## 2019-02-07 DIAGNOSIS — F3341 Major depressive disorder, recurrent, in partial remission: Secondary | ICD-10-CM | POA: Diagnosis not present

## 2019-02-07 DIAGNOSIS — F331 Major depressive disorder, recurrent, moderate: Secondary | ICD-10-CM | POA: Diagnosis not present

## 2019-02-07 DIAGNOSIS — F902 Attention-deficit hyperactivity disorder, combined type: Secondary | ICD-10-CM | POA: Diagnosis not present

## 2019-02-07 DIAGNOSIS — F321 Major depressive disorder, single episode, moderate: Secondary | ICD-10-CM | POA: Diagnosis not present

## 2019-02-07 MED ORDER — GABAPENTIN 300 MG PO CAPS: 300.0000 mg | ORAL_CAPSULE | Freq: Every day | ORAL | 1 refills | Status: DC

## 2019-02-07 MED ORDER — ESCITALOPRAM OXALATE 5 MG PO TABS: 2.5000 mg | ORAL_TABLET | Freq: Two times a day (BID) | ORAL | 1 refills | Status: DC

## 2019-02-07 MED ORDER — CLONAZEPAM 0.5 MG PO TABS: ORAL_TABLET | ORAL | 1 refills | Status: DC

## 2019-02-07 MED FILL — ESCITALOPRAM 5 MG TABLET: 5 | 30 days supply | Qty: 30 | Fill #0
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
## 2019-02-10 MED FILL — clonazePAM 0.5 MG TABS: 0.5 | 15 days supply | Qty: 30 | Fill #0

## 2019-02-15 DIAGNOSIS — R05 Cough: Secondary | ICD-10-CM | POA: Diagnosis not present

## 2019-02-15 DIAGNOSIS — J302 Other seasonal allergic rhinitis: Secondary | ICD-10-CM | POA: Diagnosis not present

## 2019-02-15 MED FILL — ALBUTEROL SULFATE HFA 108 (: 108 (90 BAS | 25 days supply | Qty: 18 | Fill #0

## 2019-02-16 DIAGNOSIS — F901 Attention-deficit hyperactivity disorder, predominantly hyperactive type: Secondary | ICD-10-CM | POA: Diagnosis not present

## 2019-03-01 DIAGNOSIS — F901 Attention-deficit hyperactivity disorder, predominantly hyperactive type: Secondary | ICD-10-CM | POA: Diagnosis not present

## 2019-03-02 MED FILL — GABAPENTIN 300 MG CAPSULE: 300 | 30 days supply | Qty: 30 | Fill #1

## 2019-03-15 DIAGNOSIS — F901 Attention-deficit hyperactivity disorder, predominantly hyperactive type: Secondary | ICD-10-CM | POA: Diagnosis not present

## 2019-03-29 DIAGNOSIS — F901 Attention-deficit hyperactivity disorder, predominantly hyperactive type: Secondary | ICD-10-CM | POA: Diagnosis not present

## 2019-04-13 MED FILL — GABAPENTIN 300 MG CAPSULE: 300 | 30 days supply | Qty: 30 | Fill #0

## 2019-04-13 MED FILL — clonazePAM 0.5 MG TABS: 0.5 | 15 days supply | Qty: 30 | Fill #1

## 2019-04-13 MED FILL — ESCITALOPRAM 5 MG TABLET: 5 | 30 days supply | Qty: 30 | Fill #1

## 2019-04-14 DIAGNOSIS — F901 Attention-deficit hyperactivity disorder, predominantly hyperactive type: Secondary | ICD-10-CM | POA: Diagnosis not present

## 2019-05-05 DIAGNOSIS — F901 Attention-deficit hyperactivity disorder, predominantly hyperactive type: Secondary | ICD-10-CM | POA: Diagnosis not present

## 2019-05-26 DIAGNOSIS — F901 Attention-deficit hyperactivity disorder, predominantly hyperactive type: Secondary | ICD-10-CM | POA: Diagnosis not present

## 2019-06-23 DIAGNOSIS — F901 Attention-deficit hyperactivity disorder, predominantly hyperactive type: Secondary | ICD-10-CM | POA: Diagnosis not present

## 2019-07-05 DIAGNOSIS — F901 Attention-deficit hyperactivity disorder, predominantly hyperactive type: Secondary | ICD-10-CM | POA: Diagnosis not present

## 2019-07-18 DIAGNOSIS — F901 Attention-deficit hyperactivity disorder, predominantly hyperactive type: Secondary | ICD-10-CM | POA: Diagnosis not present

## 2019-08-16 DIAGNOSIS — F901 Attention-deficit hyperactivity disorder, predominantly hyperactive type: Secondary | ICD-10-CM | POA: Diagnosis not present

## 2019-09-11 DIAGNOSIS — R0602 Shortness of breath: Secondary | ICD-10-CM | POA: Diagnosis not present

## 2019-09-11 DIAGNOSIS — R05 Cough: Secondary | ICD-10-CM | POA: Diagnosis not present

## 2019-09-11 DIAGNOSIS — R509 Fever, unspecified: Secondary | ICD-10-CM | POA: Diagnosis not present

## 2019-09-11 DIAGNOSIS — R5381 Other malaise: Secondary | ICD-10-CM | POA: Diagnosis not present

## 2019-09-15 DIAGNOSIS — F901 Attention-deficit hyperactivity disorder, predominantly hyperactive type: Secondary | ICD-10-CM | POA: Diagnosis not present

## 2019-09-21 ENCOUNTER — Other Ambulatory Visit: Payer: Self-pay

## 2019-09-21 ENCOUNTER — Emergency Department (HOSPITAL_COMMUNITY)
Admission: EM | Admit: 2019-09-21 | Discharge: 2019-09-21 | Disposition: A | Discharge status: Home / Self Care | Payer: 59 | Attending: Emergency Medicine | Admitting: Emergency Medicine

## 2019-09-21 ENCOUNTER — Encounter (HOSPITAL_COMMUNITY): Payer: Self-pay

## 2019-09-21 DIAGNOSIS — Z79899 Other long term (current) drug therapy: Secondary | ICD-10-CM | POA: Diagnosis not present

## 2019-09-21 DIAGNOSIS — Y990 Civilian activity done for income or pay: Secondary | ICD-10-CM | POA: Insufficient documentation

## 2019-09-21 DIAGNOSIS — Y9269 Other specified industrial and construction area as the place of occurrence of the external cause: Secondary | ICD-10-CM | POA: Insufficient documentation

## 2019-09-21 DIAGNOSIS — S61011A Laceration without foreign body of right thumb without damage to nail, initial encounter: Secondary | ICD-10-CM | POA: Insufficient documentation

## 2019-09-21 DIAGNOSIS — W2209XA Striking against other stationary object, initial encounter: Secondary | ICD-10-CM | POA: Diagnosis not present

## 2019-09-21 DIAGNOSIS — Z23 Encounter for immunization: Secondary | ICD-10-CM | POA: Insufficient documentation

## 2019-09-21 DIAGNOSIS — Y9389 Activity, other specified: Secondary | ICD-10-CM | POA: Diagnosis not present

## 2019-09-21 MED ORDER — TETANUS-DIPHTH-ACELL PERTUSSIS 5-2.5-18.5 LF-MCG/0.5 IM SUSP
0.5000 mL | Freq: Once | INTRAMUSCULAR | Status: AC
  Administered 2019-09-21: 0.5 mL via INTRAMUSCULAR
  Filled 2019-09-21: qty 0.5

## 2019-09-21 NOTE — ED Triage Notes (Signed)
Patient reports cutting his thumb on right hand on a rusty pipe.   Bleeding is controled.  New bandaged applied to laceration.  5/10 throbbing pain   Patient reports last tetanus 7 years ago.   A/Ox4 Ambulatory in triage.

## 2019-09-21 NOTE — ED Provider Notes (Signed)
[facility]-EMERGENCY DEPT Provider Note   CSN: [id number] Arrival date & time: 09/21/19  1757     History   Chief Complaint Chief Complaint  Patient presents with  . Laceration    HPI Patrick Burnett is a 23 y.o. male with a past medical history of ADHD and mood disorder who presents to the ED due to a laceration to his right thumb.  Patient states he works on a [employer] site and cut his right thumb on a rusty pipe just prior to arrival.  Hemostasis achieved prior to arrival.  Patient's last tetanus shot was roughly 7 years ago.  Patient notes after the incident he applied pressure to the laceration.  Patient denies numbness and tingling.  He notes he is no longer in any pain. No medication prior to arrival. Patient denies decreased ROM, fever, chills, abdominal pain, chest pain, and shortness of breath.  Past Medical History:  Diagnosis Date  . ADHD (attention deficit hyperactivity disorder) 12/30/2012  . Attention deficit hyperactivity disorder (ADHD)   . Mood disorder [facility])     Patient Active Problem List   Diagnosis Date Noted  . Generalized anxiety disorder 12/21/2018  . Depersonalization-derealization disorder (HCC) 12/21/2018  . Rectal bleeding 10/04/2016  . LLQ abdominal pain 10/04/2016  . Attention deficit hyperactivity disorder (ADHD), combined type, moderate 10/04/2016  . Poor sleep hygiene 01/04/2015  . Recurrent major depression in partial remission (HCC) 12/31/2012  . Insomnia 12/30/2012    Past Surgical History:  Procedure Laterality Date  . none          Home Medications    Prior to Admission medications   Medication Sig Start Date End Date Taking? Authorizing Provider  clonazePAM (KLONOPIN) 0.5 MG tablet Take 1 tablet 0.5 mg every bedtime and 1/2 tablet up to twice daily as needed for anxiety attack 02/07/19   Chauncey Mann, MD  escitalopram (LEXAPRO) 5 MG tablet Take 0.5 tablets (2.5 mg total) by mouth 2 (two) times daily  at 8 am and 10 pm. 02/07/19   Chauncey Mann, MD  fluticasone [facility]) 50 MCG/ACT nasal spray Place into both nostrils daily.    [provider]  gabapentin (NEURONTIN) 300 MG capsule Take 1 capsule (300 mg total) by mouth at bedtime. 02/07/19   Chauncey Mann, MD  loratadine (CLARITIN) 10 MG tablet Take 10 mg by mouth daily.    [provider]  promethazine-dextromethorphan (PROMETHAZINE-DM) 6.25-15 MG/5ML syrup Take 5 mLs by mouth 4 (four) times daily as needed for cough. 10/01/18   Eulis Foster, FNP    Family History Family History  Problem Relation Age of Onset  . Hypercholesterolemia Mother   . Anxiety disorder Mother   . Sleep apnea Mother   . Diabetes Father   . Hypercholesterolemia Father   . Anxiety disorder Father   . Colon cancer Paternal Grandfather   . Stomach cancer Neg Hx   . Esophageal cancer Neg Hx   . Rectal cancer Neg Hx   . Liver cancer Neg Hx     Social History Social History   Tobacco Use  . Smoking status: Never Smoker  . Smokeless tobacco: Never Used  Substance Use Topics  . Alcohol use: Yes  . Drug use: No     Allergies   Amoxicillin, Penicillins, and Risperidone and related   Review of Systems Review of Systems  Constitutional: Negative for chills and fever.  Musculoskeletal: Negative for joint swelling.  Skin: Positive for wound.  Neurological: Negative for numbness.     Physical Exam Updated Vital Signs BP 120/86 (BP Location: Left Arm)   Pulse 76   Temp 97.9 F (36.6 C) (Oral)   Resp 18   SpO2 96%   Physical Exam Vitals signs and nursing note reviewed.  Constitutional:      General: He is not in acute distress.    Appearance: He is not ill-appearing.  HENT:     Head: Normocephalic.  Neck:     Musculoskeletal: Neck supple.  Cardiovascular:     Rate and Rhythm: Normal rate and regular rhythm.     Pulses: Normal pulses.     Heart sounds: Normal heart sounds. No murmur. No friction rub. No gallop.    Pulmonary:     Effort: Pulmonary effort is normal.     Breath sounds: Normal breath sounds.  Abdominal:     General: Abdomen is flat. There is no distension.     Palpations: Abdomen is soft.     Tenderness: There is no abdominal tenderness.  Musculoskeletal:     Comments: 1cm shallow laceration over tip of right thumb. Hemostasis achieved. Dried blood surrounding thumb and hand. Full ROM of thumb and all fingers. Full ROM of wrist. No erythema. No edema. Sensation and pulses intact bilaterally.  Skin:    General: Skin is warm.  Neurological:     General: No focal deficit present.     Mental Status: He is alert.      ED Treatments / Results  Labs (all labs ordered are listed, but only abnormal results are displayed) Labs Reviewed - No data to display  EKG None  Radiology No results found.  Procedures Procedures (including critical care time)  Medications Ordered in ED Medications  Tdap (BOOSTRIX) injection 0.5 mL (0.5 mLs Intramuscular Given 09/21/19 1924)     Initial Impression / Assessment and Plan / ED Course  I have reviewed the triage vital signs and the nursing notes.  Pertinent labs & imaging results that were available during my care of the patient were reviewed by me and considered in my medical decision making (see chart for details).       23 year old male presents to the ED due to right thumb laceration that occurred just prior to arrival. Vitals reviewed and all within normal limits. Patient in no acute distress and well appearing. Right thumb with shallow 1cm laceration over tip of finger. Neurovascularly intact. Full ROM of all fingers and wrist. No snuffbox tenderness. Hemostasis achiever. Laceration too shallow to suture. Tetanus shot given. Wound thoroughly cleaned and bandaged. Instructed patient he can take OTC ibuprofen as needed for pain. Patient has been advised to keep wound clean. Strict ED precautions discussed with patient. Patient states  understanding and agrees to plan. Patient discharged home in no acute distress and vitals within normal limits.  Final Clinical Impressions(s) / ED Diagnoses   Final diagnoses:  Laceration of right thumb without foreign body without damage to nail, initial encounter    ED Discharge Orders    None       Romie Levee 09/21/19 2026    Valarie Merino, MD 09/22/19 0000

## 2019-09-21 NOTE — Discharge Instructions (Signed)
You may take over the counter ibuprofen as needed for pain. Keep area clean. You may use neosporin over cut. I recommend keeping cut covered while you are working. If your finger does not feel better within the next week, follow up with your PCP. Return to the ER for new or worsening symptoms.

## 2019-10-11 DIAGNOSIS — F901 Attention-deficit hyperactivity disorder, predominantly hyperactive type: Secondary | ICD-10-CM | POA: Diagnosis not present

## 2019-10-20 DIAGNOSIS — J3081 Allergic rhinitis due to animal (cat) (dog) hair and dander: Secondary | ICD-10-CM | POA: Diagnosis not present

## 2019-10-20 DIAGNOSIS — J301 Allergic rhinitis due to pollen: Secondary | ICD-10-CM | POA: Diagnosis not present

## 2019-10-20 DIAGNOSIS — J3089 Other allergic rhinitis: Secondary | ICD-10-CM | POA: Diagnosis not present

## 2019-10-20 DIAGNOSIS — H1045 Other chronic allergic conjunctivitis: Secondary | ICD-10-CM | POA: Diagnosis not present

## 2019-10-20 MED FILL — FLUTICASONE PROP 50 MCG SPR: 50 | 30 days supply | Qty: 16 | Fill #0

## 2019-11-15 DIAGNOSIS — F901 Attention-deficit hyperactivity disorder, predominantly hyperactive type: Secondary | ICD-10-CM | POA: Diagnosis not present

## 2020-02-27 DIAGNOSIS — R42 Dizziness and giddiness: Secondary | ICD-10-CM | POA: Diagnosis not present

## 2020-02-27 DIAGNOSIS — R0981 Nasal congestion: Secondary | ICD-10-CM | POA: Diagnosis not present

## 2020-02-27 DIAGNOSIS — R05 Cough: Secondary | ICD-10-CM | POA: Diagnosis not present

## 2020-02-27 DIAGNOSIS — U071 COVID-19: Secondary | ICD-10-CM | POA: Diagnosis not present

## 2020-04-07 DIAGNOSIS — J22 Unspecified acute lower respiratory infection: Secondary | ICD-10-CM | POA: Diagnosis not present

## 2020-04-07 DIAGNOSIS — Z03818 Encounter for observation for suspected exposure to other biological agents ruled out: Secondary | ICD-10-CM | POA: Diagnosis not present

## 2020-04-07 DIAGNOSIS — J019 Acute sinusitis, unspecified: Secondary | ICD-10-CM | POA: Diagnosis not present

## 2020-04-07 DIAGNOSIS — J309 Allergic rhinitis, unspecified: Secondary | ICD-10-CM | POA: Diagnosis not present

## 2020-05-26 DIAGNOSIS — Z20822 Contact with and (suspected) exposure to covid-19: Secondary | ICD-10-CM | POA: Diagnosis not present

## 2020-05-26 DIAGNOSIS — Z20828 Contact with and (suspected) exposure to other viral communicable diseases: Secondary | ICD-10-CM | POA: Diagnosis not present

## 2020-06-05 ENCOUNTER — Emergency Department (HOSPITAL_BASED_OUTPATIENT_CLINIC_OR_DEPARTMENT_OTHER): Payer: 59

## 2020-06-05 ENCOUNTER — Encounter (HOSPITAL_BASED_OUTPATIENT_CLINIC_OR_DEPARTMENT_OTHER): Payer: Self-pay | Admitting: *Deleted

## 2020-06-05 ENCOUNTER — Emergency Department (HOSPITAL_BASED_OUTPATIENT_CLINIC_OR_DEPARTMENT_OTHER)
Admission: EM | Admit: 2020-06-05 | Discharge: 2020-06-05 | Disposition: A | Discharge status: Home / Self Care | Payer: 59 | Attending: Emergency Medicine | Admitting: Emergency Medicine

## 2020-06-05 ENCOUNTER — Other Ambulatory Visit: Payer: Self-pay

## 2020-06-05 DIAGNOSIS — Y990 Civilian activity done for income or pay: Secondary | ICD-10-CM | POA: Insufficient documentation

## 2020-06-05 DIAGNOSIS — Y9259 Other trade areas as the place of occurrence of the external cause: Secondary | ICD-10-CM | POA: Insufficient documentation

## 2020-06-05 DIAGNOSIS — Z880 Allergy status to penicillin: Secondary | ICD-10-CM | POA: Diagnosis not present

## 2020-06-05 DIAGNOSIS — Y939 Activity, unspecified: Secondary | ICD-10-CM | POA: Insufficient documentation

## 2020-06-05 DIAGNOSIS — S20219A Contusion of unspecified front wall of thorax, initial encounter: Secondary | ICD-10-CM | POA: Diagnosis not present

## 2020-06-05 DIAGNOSIS — R1011 Right upper quadrant pain: Secondary | ICD-10-CM | POA: Diagnosis not present

## 2020-06-05 DIAGNOSIS — X58XXXA Exposure to other specified factors, initial encounter: Secondary | ICD-10-CM | POA: Insufficient documentation

## 2020-06-05 DIAGNOSIS — S3991XA Unspecified injury of abdomen, initial encounter: Secondary | ICD-10-CM | POA: Diagnosis present

## 2020-06-05 DIAGNOSIS — S301XXA Contusion of abdominal wall, initial encounter: Secondary | ICD-10-CM | POA: Diagnosis not present

## 2020-06-05 LAB — COMPREHENSIVE METABOLIC PANEL
ALT: 22 U/L (ref 0–44)
AST: 24 U/L (ref 15–41)
Albumin: 4.7 g/dL (ref 3.5–5.0)
Alkaline Phosphatase: 86 U/L (ref 38–126)
Anion gap: 12 (ref 5–15)
BUN: 17 mg/dL (ref 6–20)
CO2: 26 mmol/L (ref 22–32)
Calcium: 9.4 mg/dL (ref 8.9–10.3)
Chloride: 100 mmol/L (ref 98–111)
Creatinine, Ser: 0.94 mg/dL (ref 0.61–1.24)
GFR calc Af Amer: >60 mL/min (ref >60)
GFR calc non Af Amer: >60 mL/min (ref >60)
Glucose, Bld: 97 mg/dL (ref 70–99)
Potassium: 3.9 mmol/L (ref 3.5–5.1)
Sodium: 138 mmol/L (ref 135–145)
Total Bilirubin: 0.7 mg/dL (ref 0.3–1.2)
Total Protein: 8 g/dL (ref 6.5–8.1)

## 2020-06-05 LAB — URINALYSIS, ROUTINE W REFLEX MICROSCOPIC
Bilirubin Urine: NEGATIVE
Glucose, UA: NEGATIVE mg/dL
Ketones, ur: NEGATIVE mg/dL
Leukocytes,Ua: NEGATIVE
Nitrite: NEGATIVE
Protein, ur: NEGATIVE mg/dL
Specific Gravity, Urine: 1.02 (ref 1.005–1.030)
pH: 6.5 (ref 5.0–8.0)

## 2020-06-05 LAB — CBC WITH DIFFERENTIAL/PLATELET
Abs Immature Granulocytes: 0.03 10*3/uL (ref 0.00–0.07)
Basophils Absolute: 0 10*3/uL (ref 0.0–0.1)
Basophils Relative: 0 %
Eosinophils Absolute: 0.1 10*3/uL (ref 0.0–0.5)
Eosinophils Relative: 1 %
HCT: 46.4 % (ref 39.0–52.0)
Hemoglobin: 16.3 g/dL (ref 13.0–17.0)
Immature Granulocytes: 0 %
Lymphocytes Relative: 15 %
Lymphs Abs: 1.5 10*3/uL (ref 0.7–4.0)
MCH: 30.8 pg (ref 26.0–34.0)
MCHC: 35.1 g/dL (ref 30.0–36.0)
MCV: 87.5 fL (ref 80.0–100.0)
Monocytes Absolute: 0.5 10*3/uL (ref 0.1–1.0)
Monocytes Relative: 5 %
Neutro Abs: 8 10*3/uL — ABNORMAL HIGH (ref 1.7–7.7)
Neutrophils Relative %: 79 %
Platelets: 235 10*3/uL (ref 150–400)
RBC: 5.3 MIL/uL (ref 4.22–5.81)
RDW: 11.2 % — ABNORMAL LOW (ref 11.5–15.5)
WBC: 10.1 10*3/uL (ref 4.0–10.5)
nRBC: 0 % (ref 0.0–0.2)

## 2020-06-05 LAB — URINALYSIS, MICROSCOPIC (REFLEX): WBC, UA: NONE SEEN WBC/hpf (ref 0–5)

## 2020-06-05 LAB — LIPASE, BLOOD: Lipase: 29 U/L (ref 11–51)

## 2020-06-05 MED ORDER — IOHEXOL 300 MG/ML  SOLN
100.0000 mL | Freq: Once | INTRAMUSCULAR | Status: AC | PRN
  Administered 2020-06-05: 100 mL via INTRAVENOUS

## 2020-06-05 NOTE — Discharge Instructions (Signed)
Alternate Tylenol and Motrin as needed for abdominal pain.  Follow-up with your primary care provider in 2 days for continued evaluation.  Return to the emergency room as needed for symptoms, such as new or worse abdominal pain, vomiting, fevers or any concerns at all.

## 2020-06-05 NOTE — ED Provider Notes (Signed)
[facility] EMERGENCY DEPARTMENT Provider Note   CSN: [id number] Arrival date & time: 06/05/20  1804     History Chief Complaint  Patient presents with  . Abdominal Injury    Patrick Burnett is a 24 y.o. male.  HPI  24 year old male presents with abdominal pain for the last 6 days.  Patient states that 6 days ago he was at work when he turned a corner quickly and walked into a piece of equipment.  He notes bruising to the right lower chest and right abdomen.  He states that today he had abdominal pain with pressure which she stated felt like "there was fluid between my organs."  He states pressures gradually improved throughout the day.  He denies any associated nausea, vomiting, diarrhea, constipation.  He denies any chest pain, shortness of breath.     Past Medical History:  Diagnosis Date  . ADHD (attention deficit hyperactivity disorder) 12/30/2012  . Attention deficit hyperactivity disorder (ADHD)   . Mood disorder [facility])     Patient Active Problem List   Diagnosis Date Noted  . Generalized anxiety disorder 12/21/2018  . Depersonalization-derealization disorder (HCC) 12/21/2018  . Rectal bleeding 10/04/2016  . LLQ abdominal pain 10/04/2016  . Attention deficit hyperactivity disorder (ADHD), combined type, moderate 10/04/2016  . Poor sleep hygiene 01/04/2015  . Recurrent major depression in partial remission (HCC) 12/31/2012  . Insomnia 12/30/2012    Past Surgical History:  Procedure Laterality Date  . none         Family History  Problem Relation Age of Onset  . Hypercholesterolemia Mother   . Anxiety disorder Mother   . Sleep apnea Mother   . Diabetes Father   . Hypercholesterolemia Father   . Anxiety disorder Father   . Colon cancer Paternal Grandfather   . Stomach cancer Neg Hx   . Esophageal cancer Neg Hx   . Rectal cancer Neg Hx   . Liver cancer Neg Hx     Social History   Tobacco Use  . Smoking status: Never Smoker  . Smokeless  tobacco: Never Used  Vaping Use  . Vaping Use: Never used  Substance Use Topics  . Alcohol use: Yes  . Drug use: No    Home Medications Prior to Admission medications   Medication Sig Start Date End Date Taking? Authorizing Provider  clonazePAM (KLONOPIN) 0.5 MG tablet Take 1 tablet 0.5 mg every bedtime and 1/2 tablet up to twice daily as needed for anxiety attack 02/07/19   Chauncey Mann, MD  escitalopram (LEXAPRO) 5 MG tablet Take 0.5 tablets (2.5 mg total) by mouth 2 (two) times daily at 8 am and 10 pm. 02/07/19   Chauncey Mann, MD  fluticasone [facility]) 50 MCG/ACT nasal spray Place into both nostrils daily.    [provider]  gabapentin (NEURONTIN) 300 MG capsule Take 1 capsule (300 mg total) by mouth at bedtime. 02/07/19   Chauncey Mann, MD  loratadine (CLARITIN) 10 MG tablet Take 10 mg by mouth daily.    [provider]  promethazine-dextromethorphan (PROMETHAZINE-DM) 6.25-15 MG/5ML syrup Take 5 mLs by mouth 4 (four) times daily as needed for cough. 10/01/18   Worthy Rancher B, FNP    Allergies    Amoxicillin, Penicillins, and Risperidone and related  Review of Systems   Review of Systems  Constitutional: Negative for chills and fever.  Respiratory: Negative for shortness of breath.   Cardiovascular: Negative for chest pain.  Gastrointestinal: Positive for abdominal pain.  Negative for nausea and vomiting.  All other systems reviewed and are negative.   Physical Exam Updated Vital Signs BP 121/79 (BP Location: Right Arm)   Pulse 61   Temp 98.2 F (36.8 C) (Oral)   Resp 16   Ht 5\' 8"  (1.727 m)   Wt 72.6 kg   SpO2 100%   BMI 24.33 kg/m   Physical Exam Vitals and nursing note reviewed.  Constitutional:      Appearance: He is well-developed.  HENT:     Head: Normocephalic and atraumatic.  Eyes:     Conjunctiva/sclera: Conjunctivae normal.  Cardiovascular:     Rate and Rhythm: Normal rate and regular rhythm.     Heart sounds: Normal heart  sounds. No murmur heard.   Pulmonary:     Effort: Pulmonary effort is normal. No respiratory distress.     Breath sounds: Normal breath sounds. No wheezing or rales.  Abdominal:     General: Bowel sounds are normal. There is no distension.     Palpations: Abdomen is soft.     Tenderness: There is abdominal tenderness in the right upper quadrant.     Comments: With distraction patient has no abdominal pain.  However when discussing his abdominal exam he starts wincing when the provider presses on the right upper quadrant  Musculoskeletal:        General: No tenderness or deformity. Normal range of motion.     Cervical back: Neck supple.  Skin:    General: Skin is warm and dry.     Findings: Ecchymosis present. No erythema or rash.     Comments: There are 3 wheezes noted to the right side, 1 over the ribs at approximately rib 5 along the medial aspect, 1 over rib 8 and 1 bruise noted to the right upper quadrant.  All bruises are approximately the size of a dime  Neurological:     Mental Status: He is alert and oriented to person, place, and time.  Psychiatric:        Behavior: Behavior normal.     ED Results / Procedures / Treatments   Labs (all labs ordered are listed, but only abnormal results are displayed) Labs Reviewed  URINALYSIS, ROUTINE W REFLEX MICROSCOPIC - Abnormal; Notable for the following components:      Result Value   Hgb urine dipstick TRACE (*)    All other components within normal limits  CBC WITH DIFFERENTIAL/PLATELET - Abnormal; Notable for the following components:   RDW 11.2 (*)    Neutro Abs 8.0 (*)    All other components within normal limits  URINALYSIS, MICROSCOPIC (REFLEX) - Abnormal; Notable for the following components:   Bacteria, UA RARE (*)    All other components within normal limits  COMPREHENSIVE METABOLIC PANEL  LIPASE, BLOOD    EKG None  Radiology No results found.  Procedures Procedures (including critical care  time)  Medications Ordered in ED Medications  iohexol (OMNIPAQUE) 300 MG/ML solution 100 mL (has no administration in time range)    ED Course  I have reviewed the triage vital signs and the nursing notes.  Pertinent labs & imaging results that were available during my care of the patient were reviewed by me and considered in my medical decision making (see chart for details).    MDM Rules/Calculators/A&P                           Patient presented with abdominal pain  6 days after injury.  With distraction patient had no abdominal pain.  However he did note some right upper quadrant abdominal pain when the provider asked.  Blood work unremarkable.  Had a long discussion about risks, benefits and alternatives to CT scan at this time.  Patient understands risk of CT and still wished to proceed.  CT shows no acute intra-abdominal abnormality.  Patient offered pain medicine in the ED and he declined.  Given return precautions.  Patient ready and stable for discharge.   At this time there does not appear to be any evidence of an acute emergency medical condition and the patient appears stable for discharge with appropriate outpatient follow up.Diagnosis was discussed with patient who verbalizes understanding and is agreeable to discharge.   Final Clinical Impression(s) / ED Diagnoses Final diagnoses:  None    Rx / DC Orders ED Discharge Orders    None       Clayborne Artist, PA-C 06/05/20 2300    Virgina Norfolk, DO 06/05/20 2302

## 2020-06-05 NOTE — ED Notes (Signed)
PT INSTRUCTED TO REMAIN NPO UNTIL FURTHER ORDERS, NPO SINCE 12OOHRS TODAY

## 2020-06-05 NOTE — ED Notes (Signed)
PLACED ON CONT POX MONITORING WITH INT NBP ASSESSMENTS

## 2020-06-05 NOTE — ED Triage Notes (Signed)
C/o abd pain after abd injury x 6 days ago

## 2020-06-05 NOTE — ED Notes (Signed)
Patient transported to CT 

## 2020-06-08 DIAGNOSIS — R109 Unspecified abdominal pain: Secondary | ICD-10-CM | POA: Diagnosis not present

## 2020-06-08 DIAGNOSIS — Y990 Civilian activity done for income or pay: Secondary | ICD-10-CM | POA: Diagnosis not present

## 2020-06-08 DIAGNOSIS — R198 Other specified symptoms and signs involving the digestive system and abdomen: Secondary | ICD-10-CM | POA: Diagnosis not present

## 2020-07-03 DIAGNOSIS — F901 Attention-deficit hyperactivity disorder, predominantly hyperactive type: Secondary | ICD-10-CM | POA: Diagnosis not present

## 2020-07-23 DIAGNOSIS — F901 Attention-deficit hyperactivity disorder, predominantly hyperactive type: Secondary | ICD-10-CM | POA: Diagnosis not present

## 2020-07-26 DIAGNOSIS — Z20822 Contact with and (suspected) exposure to covid-19: Secondary | ICD-10-CM | POA: Diagnosis not present

## 2020-07-31 DIAGNOSIS — F901 Attention-deficit hyperactivity disorder, predominantly hyperactive type: Secondary | ICD-10-CM | POA: Diagnosis not present

## 2020-08-06 DIAGNOSIS — F901 Attention-deficit hyperactivity disorder, predominantly hyperactive type: Secondary | ICD-10-CM | POA: Diagnosis not present

## 2020-08-14 DIAGNOSIS — F901 Attention-deficit hyperactivity disorder, predominantly hyperactive type: Secondary | ICD-10-CM | POA: Diagnosis not present

## 2020-08-22 ENCOUNTER — Encounter: Payer: Self-pay | Admitting: Psychiatry

## 2020-08-30 DIAGNOSIS — F901 Attention-deficit hyperactivity disorder, predominantly hyperactive type: Secondary | ICD-10-CM | POA: Diagnosis not present

## 2020-09-18 DIAGNOSIS — F901 Attention-deficit hyperactivity disorder, predominantly hyperactive type: Secondary | ICD-10-CM | POA: Diagnosis not present

## 2020-09-20 ENCOUNTER — Other Ambulatory Visit (HOSPITAL_COMMUNITY): Payer: Self-pay | Admitting: Family Medicine

## 2020-09-20 DIAGNOSIS — J22 Unspecified acute lower respiratory infection: Secondary | ICD-10-CM | POA: Diagnosis not present

## 2020-09-20 DIAGNOSIS — J019 Acute sinusitis, unspecified: Secondary | ICD-10-CM | POA: Diagnosis not present

## 2020-09-20 DIAGNOSIS — Z03818 Encounter for observation for suspected exposure to other biological agents ruled out: Secondary | ICD-10-CM | POA: Diagnosis not present

## 2020-09-20 DIAGNOSIS — H9192 Unspecified hearing loss, left ear: Secondary | ICD-10-CM | POA: Diagnosis not present

## 2020-09-20 MED FILL — DOXYCYCLINE HYCLATE 100 MG: 100 | 7 days supply | Qty: 14 | Fill #0

## 2020-10-23 DIAGNOSIS — F901 Attention-deficit hyperactivity disorder, predominantly hyperactive type: Secondary | ICD-10-CM | POA: Diagnosis not present

## 2020-10-30 DIAGNOSIS — J22 Unspecified acute lower respiratory infection: Secondary | ICD-10-CM | POA: Diagnosis not present

## 2020-10-30 DIAGNOSIS — U071 COVID-19: Secondary | ICD-10-CM | POA: Diagnosis not present

## 2021-01-23 ENCOUNTER — Other Ambulatory Visit (HOSPITAL_COMMUNITY): Payer: Self-pay | Admitting: Family Medicine

## 2021-01-23 MED FILL — ESCITALOPRAM 10 MG TABLET: 10 | 30 days supply | Qty: 30 | Fill #0

## 2021-02-11 ENCOUNTER — Encounter: Payer: Self-pay | Admitting: Behavioral Health

## 2021-02-11 ENCOUNTER — Other Ambulatory Visit (HOSPITAL_COMMUNITY): Payer: Self-pay

## 2021-02-11 ENCOUNTER — Other Ambulatory Visit: Payer: Self-pay

## 2021-02-11 ENCOUNTER — Ambulatory Visit (INDEPENDENT_AMBULATORY_CARE_PROVIDER_SITE_OTHER): Payer: No Typology Code available for payment source | Admitting: Behavioral Health

## 2021-02-11 DIAGNOSIS — F411 Generalized anxiety disorder: Secondary | ICD-10-CM | POA: Diagnosis not present

## 2021-02-11 DIAGNOSIS — F3341 Major depressive disorder, recurrent, in partial remission: Secondary | ICD-10-CM

## 2021-02-11 MED ORDER — ESCITALOPRAM OXALATE 10 MG PO TABS
ORAL_TABLET | Freq: Every day | ORAL | 1 refills | Status: DC
  Filled 2021-02-11: qty 30, 30d supply, fill #0

## 2021-02-11 NOTE — Progress Notes (Signed)
[facility]  Patient ID: Patrick Burnett Burnett,  MRN: [id number]  PCP: Catha Gosselin, MD  Date of Evaluation: 02/11/2021 Time spent:30 minutes  Chief Complaint:  Chief Complaint    Anxiety; Depression      HISTORY/CURRENT STATUS: HPI :25 year old male presents to this office for follow up medication management. He says that he was recently placed on Lexapro 10mg  and he his not sure it is working yet. "I am better over all but still having some sadness. I am currently unemployed, and it has been hard for me to keep a job due to the mental health problems". He states that his last job forced him to resign due to his issue. He states that his anxiety level is 5 on 10 scale and his depression is 3. He says his depression is always more of a problem than the anxiety. He says with no source of income he is living with his Dad.   Past Psychiatric Medication Failure: None   Individual Medical History/ Review of Systems: Changes? :No   Allergies: Amoxicillin, Penicillins, and Risperidone and related  Current Medications:  Current Outpatient Medications:  .  loratadine (CLARITIN) 10 MG tablet, Take 10 mg by mouth daily., Disp: , Rfl:  .  escitalopram (LEXAPRO) 10 MG tablet, TAKE 1 TABLET BY MOUTH DAILY, Disp: 30 tablet, Rfl: 1 .  fluticasone (FLONASE) 50 MCG/ACT nasal spray, Place into both nostrils daily. (Patient not taking: Reported on 02/11/2021), Disp: , Rfl:  Medication Side Effects: none  Family Medical/ Social History: Changes? Now unemployed.  MENTAL HEALTH EXAM:  There were no vitals taken for this visit.There is no height or weight on file to calculate BMI.  General Appearance: Casual, Fairly Groomed and Well Groomed  Eye Contact:  Good  Speech:  Clear and Coherent  Volume:  Decreased  Mood:  Anxious and Dysphoric  Affect:  Flat  Thought Process:  Coherent  Orientation:  Full (Time, Place, and Person)  Thought Content: Logical   Suicidal Thoughts:  No  Homicidal  Thoughts:  No  Memory:  WNL  Judgement:  Good  Insight:  Good  Psychomotor Activity:  Normal  Concentration:  Concentration: Good  Recall:  Good  Fund of Knowledge: Good  Language: Good  Assets:  Desire for Improvement  ADL's:  Intact  Cognition: WNL  Prognosis:  Good    DIAGNOSES:    ICD-10-CM   1. Generalized anxiety disorder  F41.1   2. Recurrent major depression in partial remission (HCC)  F33.41     Receiving Psychotherapy: Yes    RECOMMENDATIONS: Continue Lexapro 10 mg.   Will follow up in one month to reevaluate progress. Will report any side effects or worsening symptoms.  Will continue therapy.    04/13/2021, NP

## 2021-02-15 ENCOUNTER — Other Ambulatory Visit (HOSPITAL_COMMUNITY): Payer: Self-pay

## 2021-02-26 ENCOUNTER — Other Ambulatory Visit (HOSPITAL_COMMUNITY): Payer: Self-pay

## 2021-03-11 ENCOUNTER — Ambulatory Visit: Payer: No Typology Code available for payment source | Admitting: Behavioral Health

## 2021-03-13 ENCOUNTER — Ambulatory Visit: Payer: No Typology Code available for payment source | Admitting: Behavioral Health

## 2021-03-14 ENCOUNTER — Other Ambulatory Visit: Payer: Self-pay

## 2021-03-14 ENCOUNTER — Encounter: Payer: Self-pay | Admitting: Behavioral Health

## 2021-03-14 ENCOUNTER — Other Ambulatory Visit (HOSPITAL_COMMUNITY): Payer: Self-pay

## 2021-03-14 ENCOUNTER — Ambulatory Visit (INDEPENDENT_AMBULATORY_CARE_PROVIDER_SITE_OTHER): Payer: No Typology Code available for payment source | Admitting: Behavioral Health

## 2021-03-14 DIAGNOSIS — F331 Major depressive disorder, recurrent, moderate: Secondary | ICD-10-CM | POA: Diagnosis not present

## 2021-03-14 DIAGNOSIS — F411 Generalized anxiety disorder: Secondary | ICD-10-CM

## 2021-03-14 MED ORDER — ESCITALOPRAM OXALATE 10 MG PO TABS
ORAL_TABLET | Freq: Every day | ORAL | 1 refills | Status: DC
  Filled 2021-03-14 – 2021-03-27 (×2): qty 30, 30d supply, fill #0
  Filled 2021-04-29: qty 30, 30d supply, fill #1

## 2021-03-14 NOTE — Progress Notes (Signed)
[facility]  Patient ID: Patrick Burnett,  MRN: [id number]  PCP: Catha Gosselin, MD  Date of Evaluation: 03/14/2021 Time spent:30 minutes  Chief Complaint:  Chief Complaint    Anxiety; Depression; Medication Refill; Follow-up      HISTORY/CURRENT STATUS: HPI  25 year old male presents to this office for follow up medication management. He says, " I'm doing pretty good and I am back to work again". He says that he feels like his medications are helping him and he does not want to make any changes at this time. He says that his anxiety is 3 and depression is 3 today.  Reports sleeping 7-8 hours per night.Denies mania, SI/ HI. He request follow up in 3 months.   No past psychiatric medication failures noted.  Individual Medical History/ Review of Systems: Changes? :No   Allergies: Amoxicillin, Penicillins, and Risperidone and related  Current Medications:  Current Outpatient Medications:  .  escitalopram (LEXAPRO) 10 MG tablet, TAKE 1 TABLET BY MOUTH DAILY, Disp: 30 tablet, Rfl: 1 .  fluticasone (FLONASE) 50 MCG/ACT nasal spray, Place into both nostrils daily. (Patient not taking: Reported on 02/11/2021), Disp: , Rfl:  .  loratadine (CLARITIN) 10 MG tablet, Take 10 mg by mouth daily., Disp: , Rfl:  Medication Side Effects: none  Family Medical/ Social History: no  MENTAL HEALTH EXAM:  There were no vitals taken for this visit.There is no height or weight on file to calculate BMI.  General Appearance: Casual, Neat and Well Groomed  Eye Contact:  Good  Speech:  Clear and Coherent  Volume:  Normal  Mood:  NA  Affect:  Appropriate  Thought Process:  Coherent  Orientation:  Full (Time, Place, and Person)  Thought Content: Logical   Suicidal Thoughts:  No  Homicidal Thoughts:  No  Memory:  WNL  Judgement:  Good  Insight:  Good  Psychomotor Activity:  Normal  Concentration:  Concentration: Good  Recall:  Good  Fund of Knowledge: Good  Language: Good  Assets:   Desire for Improvement  ADL's:  Intact  Cognition: WNL  Prognosis:  Good    DIAGNOSES:    ICD-10-CM   1. Generalized anxiety disorder  F41.1 escitalopram (LEXAPRO) 10 MG tablet  2. Major depressive disorder, recurrent episode, moderate (HCC)  F33.1 escitalopram (LEXAPRO) 10 MG tablet    Receiving Psychotherapy: Yes    RECOMMENDATIONS:  Continue Lexapro 10 mg.   Will follow up in 3 months to reevaluate progress. Will report any side effects or worsening symptoms.  Will continue psychotherapy Greater than 50% of face to face time with patient was spent on counseling and coordination of care. We discussed level of functioning in his work environment. Discussed dosage and medication changes. Pt  Agreed that he should continue medication and not make any changes at this time. He agreed to contact office sooner if condition changes.     Joan Flores, NP

## 2021-03-27 ENCOUNTER — Other Ambulatory Visit (HOSPITAL_COMMUNITY): Payer: Self-pay

## 2021-04-18 ENCOUNTER — Other Ambulatory Visit: Payer: Self-pay

## 2021-04-18 ENCOUNTER — Encounter: Payer: Self-pay | Admitting: Family Medicine

## 2021-04-18 ENCOUNTER — Ambulatory Visit (INDEPENDENT_AMBULATORY_CARE_PROVIDER_SITE_OTHER): Payer: No Typology Code available for payment source | Admitting: Family Medicine

## 2021-04-18 VITALS — BP 119/78 | HR 62 | Temp 97.4°F | Ht 68.0 in | Wt 167.6 lb

## 2021-04-18 DIAGNOSIS — J309 Allergic rhinitis, unspecified: Secondary | ICD-10-CM

## 2021-04-18 DIAGNOSIS — Z0001 Encounter for general adult medical examination with abnormal findings: Secondary | ICD-10-CM

## 2021-04-18 DIAGNOSIS — F3341 Major depressive disorder, recurrent, in partial remission: Secondary | ICD-10-CM

## 2021-04-18 DIAGNOSIS — H9192 Unspecified hearing loss, left ear: Secondary | ICD-10-CM | POA: Diagnosis not present

## 2021-04-18 DIAGNOSIS — J029 Acute pharyngitis, unspecified: Secondary | ICD-10-CM

## 2021-04-18 DIAGNOSIS — Z6825 Body mass index (BMI) 25.0-25.9, adult: Secondary | ICD-10-CM

## 2021-04-18 DIAGNOSIS — Z13220 Encounter for screening for lipoid disorders: Secondary | ICD-10-CM | POA: Diagnosis not present

## 2021-04-18 DIAGNOSIS — F411 Generalized anxiety disorder: Secondary | ICD-10-CM

## 2021-04-18 DIAGNOSIS — E663 Overweight: Secondary | ICD-10-CM

## 2021-04-18 LAB — LIPID PANEL
Cholesterol: 167 mg/dL (ref 0–200)
HDL: 39.6 mg/dL (ref >39.00)
LDL Cholesterol: 96 mg/dL (ref 0–99)
NonHDL: 127.18
Total CHOL/HDL Ratio: 4
Triglycerides: 156 mg/dL — ABNORMAL HIGH (ref 0.0–149.0)
VLDL: 31.2 mg/dL (ref 0.0–40.0)

## 2021-04-18 LAB — COMPREHENSIVE METABOLIC PANEL
ALT: 17 U/L (ref 0–53)
AST: 17 U/L (ref 0–37)
Albumin: 4.4 g/dL (ref 3.5–5.2)
Alkaline Phosphatase: 89 U/L (ref 39–117)
BUN: 18 mg/dL (ref 6–23)
CO2: 25 mEq/L (ref 19–32)
Calcium: 9.5 mg/dL (ref 8.4–10.5)
Chloride: 104 mEq/L (ref 96–112)
Creatinine, Ser: 0.91 mg/dL (ref 0.40–1.50)
GFR: 118.6 mL/min (ref >60.00)
Glucose, Bld: 84 mg/dL (ref 70–99)
Potassium: 3.8 mEq/L (ref 3.5–5.1)
Sodium: 139 mEq/L (ref 135–145)
Total Bilirubin: 0.5 mg/dL (ref 0.2–1.2)
Total Protein: 7.4 g/dL (ref 6.0–8.3)

## 2021-04-18 LAB — CBC
HCT: 43.1 % (ref 39.0–52.0)
Hemoglobin: 15.4 g/dL (ref 13.0–17.0)
MCHC: 35.8 g/dL (ref 30.0–36.0)
MCV: 87.6 fl (ref 78.0–100.0)
Platelets: 223 10*3/uL (ref 150.0–400.0)
RBC: 4.93 Mil/uL (ref 4.22–5.81)
RDW: 12.1 % (ref 11.5–15.5)
WBC: 6 10*3/uL (ref 4.0–10.5)

## 2021-04-18 LAB — TSH: TSH: 3.19 u[IU]/mL (ref 0.35–4.50)

## 2021-04-18 NOTE — Assessment & Plan Note (Signed)
On Lexapro per psychiatry.

## 2021-04-18 NOTE — Assessment & Plan Note (Signed)
On lexapro per psychiatry.
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
## 2021-04-18 NOTE — Assessment & Plan Note (Signed)
We will give work note stating is okay for him to wear earplugs to help with sensory overload

## 2021-04-18 NOTE — Assessment & Plan Note (Signed)
Stable.  Continue Flonase and Claritin.

## 2021-04-18 NOTE — Patient Instructions (Signed)
It was very nice to see you today!  We will check blood work today.  Please continue working on diet and exercise.  Please let us know if you have recurrence of a sore throat.  Take care, Dr [name]  PLEASE NOTE:  If you had any lab tests please let us know if you have not heard back within a few days. You may see your results on mychart before we have a chance to review them but we will give you a call once they are reviewed by [place]. If we ordered any referrals today, please let us know if you have not heard from their office within the next week.   Please try these tips to maintain a healthy lifestyle:  Eat at least 3 REAL meals and 1-2 snacks per day.  Aim for no more than 5 hours between eating.  If you eat breakfast, please do so within one hour of getting up.   Each meal should contain half fruits/vegetables, one quarter protein, and one quarter carbs (no bigger than a computer mouse)  Cut down on sweet beverages. This includes juice, soda, and sweet tea.   Drink at least 1 glass of water with each meal and aim for at least 8 glasses per day  Exercise at least 150 minutes every week.   Preventive Care 56-70 Years Old, Male Preventive care refers to lifestyle choices and visits with your health care provider that can promote health and wellness. This includes: A yearly physical exam. This is also called an annual wellness visit. Regular dental and eye exams. Immunizations. Screening for certain conditions. Healthy lifestyle choices, such as: Eating a healthy diet. Getting regular exercise. Not using drugs or products that contain nicotine and tobacco. Limiting alcohol use. What can I expect for my preventive care visit? Physical exam Your health care provider may check your: Height and weight. These may be used to calculate your BMI (body mass index). BMI is a measurement that tells if you are at a healthy weight. Heart rate and blood pressure. Body temperature. Skin for  abnormal spots. Counseling Your health care provider may ask you questions about your: Past medical problems. Family's medical history. Alcohol, tobacco, and drug use. Emotional well-being. Home life and relationship well-being. Sexual activity. Diet, exercise, and sleep habits. Work and work Astronomer. Access to firearms. What immunizations do I need?  Vaccines are usually given at various ages, according to a schedule. Your health care provider will recommend vaccines for you based on your age, medicalhistory, and lifestyle or other factors, such as travel or where you work. What tests do I need? Blood tests Lipid and cholesterol levels. These may be checked every 5 years starting at age 105. Hepatitis C test. Hepatitis B test. Screening  Diabetes screening. This is done by checking your blood sugar (glucose) after you have not eaten for a while (fasting). Genital exam to check for testicular cancer or hernias. STD (sexually transmitted disease) testing, if you are at risk. Talk with your health care provider about your test results, treatment options,and if necessary, the need for more tests. Follow these instructions at home: Eating and drinking  Eat a healthy diet that includes fresh fruits and vegetables, whole grains, lean protein, and low-fat dairy products. Drink enough fluid to keep your urine pale yellow. Take vitamin and mineral supplements as recommended by your health care provider. Do not drink alcohol if your health care provider tells you not to drink. If you drink alcohol: Limit how  much you have to 0-2 drinks a day. Be aware of how much alcohol is in your drink. In the [place] one drink equals one 12 oz bottle of beer (355 mL), one 5 oz glass of wine (148 mL), or one 1 oz glass of hard liquor (44 mL).  Lifestyle Take daily care of your teeth and gums. Brush your teeth every morning and night with fluoride toothpaste. Floss one time each day. Stay active.  Exercise for at least 30 minutes 5 or more days each week. Do not use any products that contain nicotine or tobacco, such as cigarettes, e-cigarettes, and chewing tobacco. If you need help quitting, ask your health care provider. Do not use drugs. If you are sexually active, practice safe sex. Use a condom or other form of protection to prevent STIs (sexually transmitted infections). Find healthy ways to cope with stress, such as: Meditation, yoga, or listening to music. Journaling. Talking to a trusted person. Spending time with friends and family. Safety Always wear your seat belt while driving or riding in a vehicle. Do not drive: If you have been drinking alcohol. Do not ride with someone who has been drinking. When you are tired or distracted. While texting. Wear a helmet and other protective equipment during sports activities. If you have firearms in your house, make sure you follow all gun safety procedures. Seek help if you have been physically or sexually abused. What's next? Go to your health care provider once a year for an annual wellness visit. Ask your health care provider how often you should have your eyes and teeth checked. Stay up to date on all vaccines. This information is not intended to replace advice given to you by your health care provider. Make sure you discuss any questions you have with your healthcare provider. Document Revised: 07/06/2019 Document Reviewed: 10/14/2018 Elsevier Patient Education  2022 [employer].

## 2021-04-18 NOTE — Progress Notes (Signed)
Please inform patient of the following:  Good news! Labs are all stable.  We can recheck again in a few years.  Patrick Burnett. [name], MD 04/18/2021 4:32 PM

## 2021-04-19 ENCOUNTER — Telehealth: Payer: Self-pay

## 2021-04-19 NOTE — Telephone Encounter (Signed)
Patient returned call regarding lab results

## 2021-04-19 NOTE — Telephone Encounter (Signed)
Results give to patient see results note

## 2021-04-29 ENCOUNTER — Other Ambulatory Visit (HOSPITAL_COMMUNITY): Payer: Self-pay

## 2021-05-01 ENCOUNTER — Other Ambulatory Visit (HOSPITAL_COMMUNITY): Payer: Self-pay

## 2021-05-08 ENCOUNTER — Telehealth: Payer: Self-pay

## 2021-05-08 NOTE — Telephone Encounter (Signed)
Type of form received: Work Accommodation  Is patient requesting call for pickup: [phone] when ready for pick up    Form placed:  [name] folder

## 2021-05-08 NOTE — Telephone Encounter (Signed)
FYI

## 2021-05-14 ENCOUNTER — Telehealth: Payer: Self-pay | Admitting: *Deleted

## 2021-05-14 NOTE — Telephone Encounter (Signed)
Accomodation questionnaire form ready to be pick up, copy send to scan  LVM form ready to be pick up

## 2021-05-28 ENCOUNTER — Other Ambulatory Visit: Payer: Self-pay | Admitting: Behavioral Health

## 2021-05-28 ENCOUNTER — Other Ambulatory Visit (HOSPITAL_COMMUNITY): Payer: Self-pay

## 2021-05-28 DIAGNOSIS — F411 Generalized anxiety disorder: Secondary | ICD-10-CM

## 2021-05-28 DIAGNOSIS — F331 Major depressive disorder, recurrent, moderate: Secondary | ICD-10-CM

## 2021-05-29 ENCOUNTER — Other Ambulatory Visit (HOSPITAL_COMMUNITY): Payer: Self-pay

## 2021-05-29 MED ORDER — ESCITALOPRAM OXALATE 10 MG PO TABS
ORAL_TABLET | Freq: Every day | ORAL | 0 refills | Status: DC
  Filled 2021-05-29: qty 30, 30d supply, fill #0
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
## 2021-06-14 ENCOUNTER — Ambulatory Visit (INDEPENDENT_AMBULATORY_CARE_PROVIDER_SITE_OTHER): Payer: No Typology Code available for payment source | Admitting: Behavioral Health

## 2021-06-14 ENCOUNTER — Other Ambulatory Visit (HOSPITAL_COMMUNITY): Payer: Self-pay

## 2021-06-14 ENCOUNTER — Other Ambulatory Visit: Payer: Self-pay

## 2021-06-14 ENCOUNTER — Encounter: Payer: Self-pay | Admitting: Behavioral Health

## 2021-06-14 DIAGNOSIS — F411 Generalized anxiety disorder: Secondary | ICD-10-CM | POA: Diagnosis not present

## 2021-06-14 DIAGNOSIS — F3341 Major depressive disorder, recurrent, in partial remission: Secondary | ICD-10-CM | POA: Diagnosis not present

## 2021-06-14 MED ORDER — ESCITALOPRAM OXALATE 10 MG PO TABS
ORAL_TABLET | Freq: Every day | ORAL | 3 refills | Status: DC
  Filled 2021-06-14: qty 30, fill #0
  Filled 2021-06-25: qty 30, 30d supply, fill #0
  Filled 2021-07-31: qty 30, 30d supply, fill #1
  Filled 2021-09-10: qty 30, 30d supply, fill #2

## 2021-06-14 NOTE — Progress Notes (Signed)
[facility]  Patient ID: Patrick Burnett,  MRN: [id number]  PCP: Ardith Dark, MD  Date of Evaluation: 06/14/2021 Time spent:20 minutes  Chief Complaint:  Chief Complaint   Anxiety; Depression; Medication Refill; Follow-up     HISTORY/CURRENT STATUS: HPI 25 year old male presents to this office for follow up and medication refill. He says the is current doing great. He reports very little anxiety or depressive symptoms at this time. He attributes more positive working environment and socialization for upbeat attitude. He is sleeping 7-8 hours per night. He says Lexapro 10 mg is working well and does not want to make any medication changes at this time. He agrees to 3 months follow up. No mania, No SI or HI.   No past psychiatric medication failures noted.   Individual Medical History/ Review of Systems: Changes? :No   Allergies: Amoxicillin, Penicillins, and Risperidone and related  Current Medications:  Current Outpatient Medications:    ergocalciferol (VITAMIN D2) 1.25 MG (50000 UT) capsule, Take 50,000 Units by mouth once a week., Disp: , Rfl:    escitalopram (LEXAPRO) 10 MG tablet, TAKE 1 TABLET BY MOUTH DAILY, Disp: 30 tablet, Rfl: 3   fluticasone (FLONASE) 50 MCG/ACT nasal spray, Place into both nostrils daily., Disp: , Rfl:    loratadine (CLARITIN) 10 MG tablet, Take 10 mg by mouth daily., Disp: , Rfl:    Multiple Vitamins-Minerals (MULTI ADULT GUMMIES) CHEW, See admin instructions., Disp: , Rfl:  Medication Side Effects: none  Family Medical/ Social History: Changes? No  MENTAL HEALTH EXAM:  There were no vitals taken for this visit.There is no height or weight on file to calculate BMI.  General Appearance: Casual and Neat  Eye Contact:  Good  Speech:  Clear and Coherent  Volume:  Normal  Mood:  NA  Affect:  Appropriate  Thought Process:  Coherent  Orientation:  Full (Time, Place, and Person)  Thought Content: Logical   Suicidal Thoughts:  No   Homicidal Thoughts:  No  Memory:  WNL  Judgement:  Good  Insight:  Good  Psychomotor Activity:  Normal  Concentration:  Concentration: Good  Recall:  Good  Fund of Knowledge: Good  Language: Good  Assets:  Desire for Improvement  ADL's:  Intact  Cognition: WNL  Prognosis:  Good    DIAGNOSES:    ICD-10-CM   1. Recurrent major depression in partial remission (HCC)  F33.41     2. Generalized anxiety disorder  F41.1 escitalopram (LEXAPRO) 10 MG tablet      Receiving Psychotherapy: Yes    RECOMMENDATIONS:   Continue Lexapro 10 mg.   Will follow up in 3 months to reevaluate progress. Will report any side effects or worsening symptoms.  Will continue psychotherapy Greater than 50% of  20 min face to face time with patient was spent on counseling and coordination of care. We discussed level of functioning in his work environment. Talked about his improvements with anxiety and depression and how he feels very stable at this time. He is more social and getting out. Discussed his light use of Delta 8 and risk of use.  Pt  Agreed that he should continue medication and not make any changes at this time. He agreed to contact office sooner if condition changes.     Joan Flores, NP

## 2021-06-25 ENCOUNTER — Other Ambulatory Visit (HOSPITAL_COMMUNITY): Payer: Self-pay

## 2021-07-06 ENCOUNTER — Other Ambulatory Visit: Payer: Self-pay

## 2021-07-06 ENCOUNTER — Emergency Department (HOSPITAL_COMMUNITY)
Admission: EM | Admit: 2021-07-06 | Discharge: 2021-07-07 | Disposition: A | Discharge status: Home / Self Care | Payer: No Typology Code available for payment source | Attending: Emergency Medicine | Admitting: Emergency Medicine

## 2021-07-06 ENCOUNTER — Encounter (HOSPITAL_COMMUNITY): Payer: Self-pay

## 2021-07-06 DIAGNOSIS — S161XXA Strain of muscle, fascia and tendon at neck level, initial encounter: Secondary | ICD-10-CM | POA: Diagnosis not present

## 2021-07-06 DIAGNOSIS — Y92410 Unspecified street and highway as the place of occurrence of the external cause: Secondary | ICD-10-CM | POA: Insufficient documentation

## 2021-07-06 DIAGNOSIS — S060X0A Concussion without loss of consciousness, initial encounter: Secondary | ICD-10-CM | POA: Insufficient documentation

## 2021-07-06 DIAGNOSIS — S0990XA Unspecified injury of head, initial encounter: Secondary | ICD-10-CM | POA: Diagnosis present
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
## 2021-07-07 ENCOUNTER — Emergency Department (HOSPITAL_COMMUNITY): Payer: No Typology Code available for payment source

## 2021-07-07 MED ORDER — IBUPROFEN 800 MG PO TABS
800.0000 mg | ORAL_TABLET | Freq: Once | ORAL | Status: AC
  Administered 2021-07-07: 800 mg via ORAL
  Filled 2021-07-07: qty 1

## 2021-07-07 MED ORDER — ACETAMINOPHEN 500 MG PO TABS
1000.0000 mg | ORAL_TABLET | Freq: Once | ORAL | Status: AC
  Administered 2021-07-07: 1000 mg via ORAL
  Filled 2021-07-07: qty 2
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
## 2021-07-31 ENCOUNTER — Other Ambulatory Visit (HOSPITAL_COMMUNITY): Payer: Self-pay

## 2021-08-20 ENCOUNTER — Encounter: Payer: Self-pay | Admitting: Family

## 2021-08-20 ENCOUNTER — Other Ambulatory Visit: Payer: Self-pay

## 2021-08-20 ENCOUNTER — Ambulatory Visit (INDEPENDENT_AMBULATORY_CARE_PROVIDER_SITE_OTHER): Payer: No Typology Code available for payment source | Admitting: Family

## 2021-08-20 DIAGNOSIS — R5383 Other fatigue: Secondary | ICD-10-CM | POA: Insufficient documentation

## 2021-08-20 LAB — VITAMIN D 25 HYDROXY (VIT D DEFICIENCY, FRACTURES): VITD: 27.72 ng/mL — ABNORMAL LOW (ref 30.00–100.00)

## 2021-08-20 NOTE — Patient Instructions (Signed)
Go to the lab for your Vitamin D level check. We will notify you in 2-3 days with the results.  Work on what we discussed in the office including: Getting back to a routine - go to sleep at the same time every night and set your alarm to wake up after 8 hours of sleep. Eat regularly, every 4 hours, does not have to be a full meal, but eat at least 2 full meals daily.  Hydrate with at least 64oz = 2 liters = 8 cups of water daily. Exercise for at least 20-30 minutes daily getting your heart rate above 110-120 for 1-2 minutes at a time and increase the time as able.
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
## 2021-08-21 NOTE — Progress Notes (Signed)
Vit. D level is low, but not low enough for a weekly supplement. But he should pick up an OTC Vitamin D up to 2,000units and take daily. Thx

## 2021-09-10 ENCOUNTER — Other Ambulatory Visit (HOSPITAL_COMMUNITY): Payer: Self-pay

## 2021-09-13 ENCOUNTER — Ambulatory Visit: Payer: No Typology Code available for payment source | Admitting: Behavioral Health

## 2021-09-16 ENCOUNTER — Encounter: Payer: Self-pay | Admitting: Behavioral Health

## 2021-09-16 ENCOUNTER — Other Ambulatory Visit (HOSPITAL_COMMUNITY): Payer: Self-pay

## 2021-09-16 ENCOUNTER — Other Ambulatory Visit: Payer: Self-pay

## 2021-09-16 ENCOUNTER — Ambulatory Visit (INDEPENDENT_AMBULATORY_CARE_PROVIDER_SITE_OTHER): Payer: No Typology Code available for payment source | Admitting: Behavioral Health

## 2021-09-16 DIAGNOSIS — F411 Generalized anxiety disorder: Secondary | ICD-10-CM | POA: Diagnosis not present

## 2021-09-16 DIAGNOSIS — F3341 Major depressive disorder, recurrent, in partial remission: Secondary | ICD-10-CM

## 2021-09-16 MED ORDER — ESCITALOPRAM OXALATE 10 MG PO TABS
ORAL_TABLET | Freq: Every day | ORAL | 3 refills | Status: DC
  Filled 2021-09-16 – 2021-10-07 (×2): qty 30, 30d supply, fill #0
  Filled 2021-11-14: qty 30, 30d supply, fill #1

## 2021-09-16 NOTE — Progress Notes (Signed)
[facility]  Patient ID: CHANDRA FEGER,  MRN: [id number]  PCP: Ardith Dark, MD  Date of Evaluation: 09/16/2021 Time spent:30 minutes  Chief Complaint:  Chief Complaint   Anxiety; Depression; Follow-up; Medication Refill     HISTORY/CURRENT STATUS: HPI  25 year old male presents to this office for follow up and medication refill. He says the is current doing great. He reports very little anxiety or depressive symptoms at this time. He attributes more positive working environment and socialization for upbeat attitude. 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 He says Lexapro 10 mg is working well and does not want to make any medication changes at this time. He agrees to 3 months follow up. No mania, No SI or HI.    No past psychiatric medication failures noted      Individual Medical History/ Review of Systems: Changes? :No   Allergies: Amoxicillin, Penicillins, and Risperidone and related  Current Medications:  Current Outpatient Medications:    ergocalciferol (VITAMIN D2) 1.25 MG (50000 UT) capsule, Take 50,000 Units by mouth once a week. (Patient not taking: Reported on 08/20/2021), Disp: , Rfl:    escitalopram (LEXAPRO) 10 MG tablet, TAKE 1 TABLET BY MOUTH DAILY, Disp: 30 tablet, Rfl: 3   fluticasone (FLONASE) 50 MCG/ACT nasal spray, Place into both nostrils daily. (Patient not taking: Reported on 08/20/2021), Disp: , Rfl:    loratadine (CLARITIN) 10 MG tablet, Take 10 mg by mouth daily., Disp: , Rfl:    Multiple Vitamins-Minerals (MULTI ADULT GUMMIES) CHEW, See admin instructions., Disp: , Rfl:  Medication Side Effects: none  Family Medical/ Social History: Changes? No  MENTAL HEALTH EXAM:  There were no vitals taken for this visit.There is no height or weight on file to calculate BMI.  General Appearance: Casual  Eye Contact:  Good  Speech:  Clear and Coherent  Volume:   Normal  Mood:  NA  Affect:  Congruent  Thought Process:  Coherent  Orientation:  Full (Time, Place, and Person)  Thought Content: Logical   Suicidal Thoughts:  No  Homicidal Thoughts:  No  Memory:  WNL  Judgement:  Good  Insight:  Good  Psychomotor Activity:  Normal  Concentration:  Concentration: Good  Recall:  Good  Fund of Knowledge: Good  Language: Good  Assets:  Desire for Improvement  ADL's:  Intact  Cognition: WNL  Prognosis:  Good    DIAGNOSES:    ICD-10-CM   1. Recurrent major depression in partial remission (HCC)  F33.41     2. Generalized anxiety disorder  F41.1 escitalopram (LEXAPRO) 10 MG tablet      Receiving Psychotherapy: Yes    RECOMMENDATIONS:  Continue Lexapro 10 mg.   Will follow up in 3 months to reevaluate progress. Will report any side effects or worsening symptoms.  Will continue psychotherapy Greater than 50% of  30 min face to face time with patient was spent on counseling and coordination of care. We discussed level of functioning in his work environment. Talked about his improvements with anxiety and depression and how he feels very stable at this time. He is more social and getting out. Discussed his light use of Delta 8 and risk of use.  Pt  Agreed that he should continue medication and not make any changes at this time. He agreed to contact office sooner if condition changes.     Joan Flores, NP

## 2021-10-07 ENCOUNTER — Other Ambulatory Visit (HOSPITAL_COMMUNITY): Payer: Self-pay

## 2021-10-22 ENCOUNTER — Other Ambulatory Visit (HOSPITAL_COMMUNITY): Payer: Self-pay

## 2021-11-14 ENCOUNTER — Other Ambulatory Visit (HOSPITAL_COMMUNITY): Payer: Self-pay

## 2021-12-17 ENCOUNTER — Ambulatory Visit: Payer: No Typology Code available for payment source | Admitting: Behavioral Health

## 2021-12-18 ENCOUNTER — Other Ambulatory Visit: Payer: Self-pay

## 2021-12-18 ENCOUNTER — Encounter: Payer: Self-pay | Admitting: Behavioral Health

## 2021-12-18 ENCOUNTER — Other Ambulatory Visit (HOSPITAL_COMMUNITY): Payer: Self-pay

## 2021-12-18 ENCOUNTER — Ambulatory Visit (INDEPENDENT_AMBULATORY_CARE_PROVIDER_SITE_OTHER): Payer: No Typology Code available for payment source | Admitting: Behavioral Health

## 2021-12-18 DIAGNOSIS — F411 Generalized anxiety disorder: Secondary | ICD-10-CM

## 2021-12-18 MED ORDER — ESCITALOPRAM OXALATE 10 MG PO TABS
ORAL_TABLET | Freq: Every day | ORAL | 3 refills | Status: DC
  Filled 2021-12-18: qty 30, 30d supply, fill #0
  Filled 2022-02-03 – 2022-02-12 (×2): qty 30, 30d supply, fill #1
  Filled 2022-03-24: qty 30, 30d supply, fill #2
  Filled 2022-05-01: qty 30, 30d supply, fill #3

## 2021-12-18 NOTE — Progress Notes (Signed)
[facility]  Patient ID: JAIMESON GOPAL,  MRN: [id number]  PCP: Ardith Dark, MD  Date of Evaluation: 12/18/2021 Time spent:30 minutes  Chief Complaint:  Chief Complaint   Anxiety; Depression; Follow-up; Medication Refill; Patient Education     HISTORY/CURRENT STATUS: HPI  26 year old male presents to this office for follow up and medication refill. He says the is current doing great. He reports very little anxiety or depressive symptoms at this time. He did end his relationship with his partner at the beginning of January. Says he is still recovering from it. Not dating anyone for awhile. He attributes more positive working environment and socialization for upbeat attitude. He is sleeping 7-8 hours per night. He says Lexapro 10 mg is working well and does not want to make any medication changes at this time. He agrees to 3 months follow up. No mania, No SI or HI.    No past psychiatric medication failures noted     Individual Medical History/ Review of Systems: Changes? :No   Allergies: Amoxicillin, Penicillins, and Risperidone and related  Current Medications:  Current Outpatient Medications:    ergocalciferol (VITAMIN D2) 1.25 MG (50000 UT) capsule, Take 50,000 Units by mouth once a week. (Patient not taking: Reported on 08/20/2021), Disp: , Rfl:    escitalopram (LEXAPRO) 10 MG tablet, TAKE 1 TABLET BY MOUTH DAILY, Disp: 30 tablet, Rfl: 3   fluticasone (FLONASE) 50 MCG/ACT nasal spray, Place into both nostrils daily. (Patient not taking: Reported on 08/20/2021), Disp: , Rfl:    loratadine (CLARITIN) 10 MG tablet, Take 10 mg by mouth daily., Disp: , Rfl:    Multiple Vitamins-Minerals (MULTI ADULT GUMMIES) CHEW, See admin instructions., Disp: , Rfl:  Medication Side Effects: none  Family Medical/ Social History: Changes? No  MENTAL HEALTH EXAM:  There were no vitals taken for this visit.There is no height or weight on file to calculate BMI.  General  Appearance: Casual, Neat, and Well Groomed  Eye Contact:  Good  Speech:  Clear and Coherent  Volume:  Normal  Mood:  NA  Affect:  Appropriate  Thought Process:  Coherent  Orientation:  Full (Time, Place, and Person)  Thought Content: Logical   Suicidal Thoughts:  No  Homicidal Thoughts:  No  Memory:  WNL  Judgement:  Good  Insight:  Good  Psychomotor Activity:  Normal  Concentration:  Concentration: Good  Recall:  Good  Fund of Knowledge: Good  Language: Good  Assets:  Desire for Improvement  ADL's:  Intact  Cognition: WNL  Prognosis:  Good    DIAGNOSES:    ICD-10-CM   1. Generalized anxiety disorder  F41.1 escitalopram (LEXAPRO) 10 MG tablet      Receiving Psychotherapy: No    RECOMMENDATIONS:    Continue Lexapro 10 mg.   Will follow up in 3 months to reevaluate progress. Will report any side effects or worsening symptoms.  Will continue psychotherapy Greater than 50% of  30 min face to face time with patient was spent on counseling and coordination of care. We discussed level of functioning in his work environment. Talked about his improvements with anxiety and depression and how he feels very stable at this time. He did recently break up with his partner beginning of January. Discussed his light use of Delta 8 and risk of use.  Pt  Agreed that he should continue medication and not make any changes at this time. He agreed to contact office sooner if condition changes.  Elwanda Brooklyn, NP

## 2022-02-03 ENCOUNTER — Other Ambulatory Visit (HOSPITAL_COMMUNITY): Payer: Self-pay

## 2022-02-11 ENCOUNTER — Other Ambulatory Visit (HOSPITAL_COMMUNITY): Payer: Self-pay

## 2022-02-12 ENCOUNTER — Other Ambulatory Visit (HOSPITAL_COMMUNITY): Payer: Self-pay

## 2022-03-17 ENCOUNTER — Ambulatory Visit (INDEPENDENT_AMBULATORY_CARE_PROVIDER_SITE_OTHER): Payer: No Typology Code available for payment source | Admitting: Behavioral Health

## 2022-03-17 ENCOUNTER — Encounter: Payer: Self-pay | Admitting: Behavioral Health

## 2022-03-17 DIAGNOSIS — F3341 Major depressive disorder, recurrent, in partial remission: Secondary | ICD-10-CM | POA: Diagnosis not present

## 2022-03-17 DIAGNOSIS — F411 Generalized anxiety disorder: Secondary | ICD-10-CM | POA: Diagnosis not present

## 2022-03-17 NOTE — Progress Notes (Signed)
[facility] ? ?Patient ID: Patrick Burnett,  ?MRN: [id number] ? ?PCP: Ardith Dark, MD ? ?Date of Evaluation: 03/17/2022 ?Time spent:20 minutes ? ?Chief Complaint:  ?Chief Complaint   ?Anxiety; Depression; Follow-up; Medication Refill ?  ? ? ?HISTORY/CURRENT STATUS: ?HPI ? ?26 year old male presents to this office for follow up and medication refill. He says he is currently doing great. He reports very little anxiety or depressive symptoms at this time. Says he move back in with his mother because living with his Dad was causing a lot of stress.Says father is a [employer] loss adjuster and does not clean up. In a new relationship, says that he is close to being intimate again. He has anxiety about premature ejaculation. He attributes more positive working environment and socialization for upbeat attitude. He is sleeping 7-8 hours per night. He says Lexapro 10 mg is working well and does not want to make any medication changes at this time. He agrees to 4 months follow up. No mania, No SI or HI.  ?  ?No past psychiatric medication failures noted ? ? ? ? ? ?Individual Medical History/ Review of Systems: Changes? :No  ? ?Allergies: Amoxicillin, Penicillins, and Risperidone and related ? ?Current Medications:  ?Current Outpatient Medications:  ?  ergocalciferol (VITAMIN D2) 1.25 MG (50000 UT) capsule, Take 50,000 Units by mouth once a week. (Patient not taking: Reported on 08/20/2021), Disp: , Rfl:  ?  escitalopram (LEXAPRO) 10 MG tablet, TAKE 1 TABLET BY MOUTH DAILY, Disp: 30 tablet, Rfl: 3 ?  fluticasone (FLONASE) 50 MCG/ACT nasal spray, Place into both nostrils daily. (Patient not taking: Reported on 08/20/2021), Disp: , Rfl:  ?  loratadine (CLARITIN) 10 MG tablet, Take 10 mg by mouth daily., Disp: , Rfl:  ?  Multiple Vitamins-Minerals (MULTI ADULT GUMMIES) CHEW, See admin instructions., Disp: , Rfl:  ?Medication Side Effects: none ? ?Family Medical/ Social History: Changes? No ? ?MENTAL HEALTH EXAM: ? ?There were no  vitals taken for this visit.There is no height or weight on file to calculate BMI.  ?General Appearance: Casual, Neat, and Well Groomed  ?Eye Contact:  Good  ?Speech:  Clear and Coherent  ?Volume:  Normal  ?Mood:  NA  ?Affect:  Appropriate  ?Thought Process:  Coherent  ?Orientation:  Full (Time, Place, and Person)  ?Thought Content: Logical   ?Suicidal Thoughts:  No  ?Homicidal Thoughts:  No  ?Memory:  WNL  ?Judgement:  Good  ?Insight:  Good  ?Psychomotor Activity:  Normal  ?Concentration:  Concentration: Good  ?Recall:  Good  ?Fund of Knowledge: Good  ?Language: Good  ?Assets:  Desire for Improvement  ?ADL's:  Intact  ?Cognition: WNL  ?Prognosis:  Good  ? ? ?DIAGNOSES:  ?  ICD-10-CM   ?1. Generalized anxiety disorder  F41.1   ?  ?2. Recurrent major depression in partial remission (HCC)  F33.41   ?  ? ? ?Receiving Psychotherapy: No  ? ? ?RECOMMENDATIONS:  ? ? Continue Lexapro 10 mg.   ?Will follow up in 4 months to reevaluate progress. ?Will report any side effects or worsening symptoms.  ?Will continue psychotherapy ?Greater than 50% of  20 min face to face time with patient was spent on counseling and coordination of care. No significant changes this visit.  We discussed level of functioning in his work environment. Talked about his improvements with anxiety and depression and how he feels very stable at this time. Discussed his light use of Delta 8 and risk of use.  Pt  Agreed that he should continue medication and not make any changes at this time.  ?We discussed his anxiety of pre mature ejaculation. Provided some tip to prevent this. Squeeze method and desensitizing cream containing lidocaine.  ? ?He agreed to contact office sooner if condition changes.  ?  ? ? ?Joan Flores, NP  ?

## 2022-03-24 ENCOUNTER — Other Ambulatory Visit (HOSPITAL_COMMUNITY): Payer: Self-pay

## 2022-05-01 ENCOUNTER — Other Ambulatory Visit (HOSPITAL_COMMUNITY): Payer: Self-pay

## 2022-05-27 ENCOUNTER — Other Ambulatory Visit: Payer: Self-pay | Admitting: Behavioral Health

## 2022-05-27 ENCOUNTER — Other Ambulatory Visit (HOSPITAL_COMMUNITY): Payer: Self-pay

## 2022-05-27 DIAGNOSIS — F411 Generalized anxiety disorder: Secondary | ICD-10-CM

## 2022-05-27 MED ORDER — ESCITALOPRAM OXALATE 10 MG PO TABS
ORAL_TABLET | Freq: Every day | ORAL | 1 refills | Status: DC
  Filled 2022-05-27: qty 30, 30d supply, fill #0
  Filled 2022-07-08: qty 30, 30d supply, fill #1
  Filled 2022-07-09: qty 30, 30d supply, fill #0

## 2022-06-04 ENCOUNTER — Other Ambulatory Visit (HOSPITAL_BASED_OUTPATIENT_CLINIC_OR_DEPARTMENT_OTHER): Payer: Self-pay

## 2022-06-04 ENCOUNTER — Encounter: Payer: Self-pay | Admitting: Physician Assistant

## 2022-06-04 ENCOUNTER — Ambulatory Visit (INDEPENDENT_AMBULATORY_CARE_PROVIDER_SITE_OTHER): Payer: No Typology Code available for payment source | Admitting: Physician Assistant

## 2022-06-04 VITALS — BP 120/82 | HR 89 | Temp 98.2°F | Ht 68.0 in | Wt 170.4 lb

## 2022-06-04 DIAGNOSIS — J029 Acute pharyngitis, unspecified: Secondary | ICD-10-CM | POA: Diagnosis not present

## 2022-06-04 LAB — POCT RAPID STREP A (OFFICE): Rapid Strep A Screen: NEGATIVE

## 2022-06-04 LAB — POC COVID19 BINAXNOW: SARS Coronavirus 2 Ag: NEGATIVE

## 2022-06-04 MED ORDER — AZITHROMYCIN 250 MG PO TABS
ORAL_TABLET | ORAL | 0 refills | Status: AC
  Filled 2022-06-04: qty 6, 5d supply, fill #0

## 2022-06-04 MED ORDER — PREDNISONE 20 MG PO TABS
40.0000 mg | ORAL_TABLET | Freq: Every day | ORAL | 0 refills | Status: DC
  Filled 2022-06-04: qty 10, 5d supply, fill #0

## 2022-06-04 NOTE — Progress Notes (Signed)
Patrick Burnett is a 26 y.o. male here for a new problem of sore throat.   History of Present Illness:   Chief Complaint  Patient presents with   Cough   Sore Throat    Pt c/o sore throat started on Monday, chills.    HPI  Sore Throat  Patient complain of sore throat that has been onset for 3 days. His associated symptoms include cough, sinus drainage and chills. Symptoms has been getting worse. Has difficulty swallowing food due to the pain. States he was outside a lot this weekend. He has tried cough drops and tea with no relief.  States he never had sore throat with sinus issue before. No known sick contacts. No other specific treatment tried. No fever. No ear pain or pressure. Denies any other aggravating or relieving factors  Past Medical History:  Diagnosis Date   ADHD (attention deficit hyperactivity disorder) 12/30/2012   Anxiety    Attention deficit hyperactivity disorder (ADHD)    Depression    Mood disorder (HCC)      Social History   Tobacco Use   Smoking status: Never   Smokeless tobacco: Never  Vaping Use   Vaping Use: Never used  Substance Use Topics   Alcohol use: Not Currently    Comment: OCC   Drug use: Yes    Comment: uses delta 8, 9, 10    Past Surgical History:  Procedure Laterality Date   none      Family History  Problem Relation Age of Onset   Hearing loss Mother    Hypercholesterolemia Mother    Anxiety disorder Mother    Sleep apnea Mother    Depression Mother    Depression Father    Diabetes Father    Hypercholesterolemia Father    Anxiety disorder Father    Hypertension Father    Depression Sister    Anxiety disorder Sister    Learning disabilities Sister    Learning disabilities Brother    Hearing loss Maternal Grandmother    Diabetes Maternal Grandfather    Hearing loss Maternal Grandfather    Colon cancer Maternal Grandfather    Hearing loss Paternal Grandmother    Hearing loss Paternal Grandfather    Colon cancer Paternal  Grandfather    Stomach cancer Neg Hx    Esophageal cancer Neg Hx    Rectal cancer Neg Hx    Liver cancer Neg Hx     Allergies  Allergen Reactions   Amoxicillin Nausea And Vomiting   Penicillins    Risperidone And Related     Current Medications:   Current Outpatient Medications:    escitalopram (LEXAPRO) 10 MG tablet, TAKE 1 TABLET BY MOUTH DAILY, Disp: 30 tablet, Rfl: 1   Multiple Vitamins-Minerals (MULTI ADULT GUMMIES) CHEW, See admin instructions., Disp: , Rfl:    fluticasone (FLONASE) 50 MCG/ACT nasal spray, Place into both nostrils daily. (Patient not taking: Reported on 08/20/2021), Disp: , Rfl:    Review of Systems:   ROS Negative unless otherwise specified per HPI.   Vitals:   Vitals:   06/04/22 1059  BP: 120/82  Pulse: 89  Temp: 98.2 F (36.8 C)  TempSrc: Temporal  SpO2: 97%  Weight: 170 lb 6.1 oz (77.3 kg)  Height: 5\' 8"  (1.727 m)     Body mass index is 25.91 kg/m.  Physical Exam:   Physical Exam Vitals and nursing note reviewed.  Constitutional:      General: He is not in acute distress.  Appearance: He is well-developed. He is not ill-appearing or toxic-appearing.  HENT:     Head: Normocephalic and atraumatic.     Right Ear: Tympanic membrane, ear canal and external ear normal. Tympanic membrane is not erythematous, retracted or bulging.     Left Ear: Tympanic membrane, ear canal and external ear normal. Tympanic membrane is not erythematous, retracted or bulging.     Nose: Nose normal.     Right Sinus: No maxillary sinus tenderness or frontal sinus tenderness.     Left Sinus: No maxillary sinus tenderness or frontal sinus tenderness.     Mouth/Throat:     Pharynx: Uvula midline. Posterior oropharyngeal erythema present.     Tonsils: 1+ on the right. 1+ on the left.  Eyes:     General: Lids are normal.     Conjunctiva/sclera: Conjunctivae normal.  Neck:     Trachea: Trachea normal.  Cardiovascular:     Rate and Rhythm: Normal rate and  regular rhythm.     Pulses: Normal pulses.     Heart sounds: Normal heart sounds, S1 normal and S2 normal.  Pulmonary:     Effort: Pulmonary effort is normal.     Breath sounds: Rhonchi present. No decreased breath sounds, wheezing or rales.     Comments: Significant productive cough Lymphadenopathy:     Cervical: No cervical adenopathy.  Skin:    General: Skin is warm and dry.  Neurological:     Mental Status: He is alert.     GCS: GCS eye subscore is 4. GCS verbal subscore is 5. GCS motor subscore is 6.  Psychiatric:        Speech: Speech normal.        Behavior: Behavior normal. Behavior is cooperative.    Results for orders placed or performed in visit on 06/04/22  POCT rapid strep A  Result Value Ref Range   Rapid Strep A Screen Negative Negative  POC COVID-19  Result Value Ref Range   SARS Coronavirus 2 Ag Negative Negative    Assessment and Plan:   Sore throat No red flags on exam.  Strep and COVID negative. Will initiate Azithromycin per orders for worsening productive cough. Oral prednisone 20 mg BID x 5 days also prescribed to help with throat pain and inflammation. Discussed taking medications as prescribed. Reviewed return precautions including new fever, SOB, worsening cough or other concerns. Push fluids and rest. I recommend that patient follow-up if symptoms worsen or persist despite treatment x 7-10 days, sooner if needed.   I,Savera Zaman,acting as a Neurosurgeon for [employer], PA.,have documented all relevant documentation on the behalf of Jarold Motto, PA,as directed by  Jarold Motto, PA while in the presence of Jarold Motto, [place].   I, Jarold Motto, [place], have reviewed all documentation for this visit. The documentation on 06/04/22 for the exam, diagnosis, procedures, and orders are all accurate and complete.   Jarold Motto, PA-C

## 2022-06-04 NOTE — Patient Instructions (Signed)
It was great to see you!  Start azithromycin and oral prednisone  Push fluids and get plenty of rest. Please return if you are not improving as expected, or if you have high fevers (>101.5) or difficulty swallowing or worsening productive cough.  Call clinic with questions.  I hope you start feeling better soon!

## 2022-06-13 ENCOUNTER — Ambulatory Visit (INDEPENDENT_AMBULATORY_CARE_PROVIDER_SITE_OTHER): Payer: No Typology Code available for payment source | Admitting: Physician Assistant

## 2022-06-13 ENCOUNTER — Encounter: Payer: Self-pay | Admitting: Physician Assistant

## 2022-06-13 ENCOUNTER — Other Ambulatory Visit (HOSPITAL_COMMUNITY): Payer: Self-pay

## 2022-06-13 VITALS — BP 124/80 | HR 63 | Temp 97.6°F | Ht 68.0 in | Wt 167.8 lb

## 2022-06-13 DIAGNOSIS — H6691 Otitis media, unspecified, right ear: Secondary | ICD-10-CM | POA: Diagnosis not present

## 2022-06-13 DIAGNOSIS — F411 Generalized anxiety disorder: Secondary | ICD-10-CM

## 2022-06-13 DIAGNOSIS — F5104 Psychophysiologic insomnia: Secondary | ICD-10-CM

## 2022-06-13 DIAGNOSIS — J329 Chronic sinusitis, unspecified: Secondary | ICD-10-CM | POA: Diagnosis not present

## 2022-06-13 DIAGNOSIS — F339 Major depressive disorder, recurrent, unspecified: Secondary | ICD-10-CM

## 2022-06-13 MED ORDER — CEFTRIAXONE SODIUM 1 G IJ SOLR
1.0000 g | Freq: Once | INTRAMUSCULAR | 0 refills | Status: AC
  Filled 2022-06-13: qty 1, 1d supply, fill #0

## 2022-06-13 MED ORDER — CEFTRIAXONE SODIUM 1 G IJ SOLR
1.0000 g | Freq: Once | INTRAMUSCULAR | Status: AC
  Administered 2022-06-13: 1 g via INTRAMUSCULAR

## 2022-06-13 NOTE — Progress Notes (Signed)
Subjective:    Patient ID: Patrick Burnett, male    DOB: 1995-12-19, 26 y.o.   MRN: [id number]  Chief Complaint  Patient presents with   Sinus Problem   Referral    Sleep study     HPI Patient is in today for recheck from 06/04/22. Took Z-pak fully & felt somewhat better. Had to stop prednisone because it made him feel "loopy."  Here with older sister. Both live with mom.  ST symptoms x 2 weeks. Bilateral ear pain R > L. General fatigue. No other symptoms to report.  Sleep deprivation - going on for over a decade. Requesting sleep study. High score on depression screening as well, has not been compliant with lexapro or f/ups with psychiatry.   Past Medical History:  Diagnosis Date   ADHD (attention deficit hyperactivity disorder) 12/30/2012   Anxiety    Attention deficit hyperactivity disorder (ADHD)    Depression    Mood disorder (HCC)     Past Surgical History:  Procedure Laterality Date   none      Family History  Problem Relation Age of Onset   Hearing loss Mother    Hypercholesterolemia Mother    Anxiety disorder Mother    Sleep apnea Mother    Depression Mother    Depression Father    Diabetes Father    Hypercholesterolemia Father    Anxiety disorder Father    Hypertension Father    Depression Sister    Anxiety disorder Sister    Learning disabilities Sister    Learning disabilities Brother    Hearing loss Maternal Grandmother    Diabetes Maternal Grandfather    Hearing loss Maternal Grandfather    Colon cancer Maternal Grandfather    Hearing loss Paternal Grandmother    Hearing loss Paternal Grandfather    Colon cancer Paternal Grandfather    Stomach cancer Neg Hx    Esophageal cancer Neg Hx    Rectal cancer Neg Hx    Liver cancer Neg Hx     Social History   Tobacco Use   Smoking status: Never   Smokeless tobacco: Never  Vaping Use   Vaping Use: Never used  Substance Use Topics   Alcohol use: Not Currently    Comment: OCC   Drug use: Yes     Comment: uses delta 8, 9, 10     Allergies  Allergen Reactions   Amoxicillin Nausea And Vomiting   Penicillins    Risperidone And Related     Review of Systems NEGATIVE UNLESS OTHERWISE INDICATED IN HPI      Objective:     BP 124/80   Pulse 63   Temp 97.6 F (36.4 C)   Ht 5\' 8"  (1.727 m)   Wt 167 lb 12.8 oz (76.1 kg)   SpO2 98%   BMI 25.51 kg/m   Wt Readings from Last 3 Encounters:  06/13/22 167 lb 12.8 oz (76.1 kg)  06/04/22 170 lb 6.1 oz (77.3 kg)  08/20/21 172 lb 9.6 oz (78.3 kg)    BP Readings from Last 3 Encounters:  06/13/22 124/80  06/04/22 120/82  08/20/21 132/90     Physical Exam Constitutional:      Appearance: Normal appearance.  HENT:     Right Ear: A middle ear effusion is present. There is no impacted cerumen. Tympanic membrane is erythematous and bulging.     Left Ear: There is no impacted cerumen. Tympanic membrane is not erythematous or bulging.  Nose: Nose normal.     Mouth/Throat:     Mouth: Mucous membranes are moist.     Pharynx: No oropharyngeal exudate.  Eyes:     Extraocular Movements: Extraocular movements intact.     Conjunctiva/sclera: Conjunctivae normal.     Pupils: Pupils are equal, round, and reactive to light.  Cardiovascular:     Rate and Rhythm: Normal rate and regular rhythm.     Pulses: Normal pulses.     Heart sounds: Normal heart sounds.  Pulmonary:     Effort: Pulmonary effort is normal.     Breath sounds: Normal breath sounds.  Skin:    Findings: No rash.  Neurological:     General: No focal deficit present.     Mental Status: He is alert and oriented to person, place, and time.  Psychiatric:        Mood and Affect: Mood is depressed. Affect is blunt.        Behavior: Behavior is agitated.        Assessment & Plan:   Problem List Items Addressed This Visit       Other   Generalized anxiety disorder   Relevant Orders   Ambulatory referral to Sleep Studies   Other Visit Diagnoses     Acute  right otitis media    -  Primary   Relevant Medications   cefTRIAXone (ROCEPHIN) 1 g injection   cefTRIAXone (ROCEPHIN) injection 1 g (Completed)   Other Relevant Orders   Ambulatory referral to ENT   Recurrent sinusitis       Relevant Medications   cefTRIAXone (ROCEPHIN) 1 g injection   cefTRIAXone (ROCEPHIN) injection 1 g (Completed)   Other Relevant Orders   Ambulatory referral to ENT   Chronic insomnia       Relevant Orders   Ambulatory referral to Sleep Studies   Depression, recurrent (HCC)       Relevant Orders   Ambulatory referral to Sleep Studies        Meds ordered this encounter  Medications   cefTRIAXone (ROCEPHIN) 1 g injection    Sig: Inject 1 g into the muscle once for 1 dose.    Dispense:  1 each    Refill:  0    Order Specific Question:   Supervising Provider    Answer:   Shelva Majestic [[id number]]   cefTRIAXone (ROCEPHIN) injection 1 g    1. Acute right otitis media 2. Recurrent sinusitis Already completed Z-pak from last visit Rocephin 1 g given in office today - monitored pt for 10 mins after admin of shot to make sure no allergic response, did great, stable for discharge  3. Chronic insomnia Referral for sleep study  4. Generalized anxiety disorder 5. Depression, recurrent (HCC) Current relapse - not taking Lexapro consistently - messaged his psych provider, Avelina Laine, informed him of screening today - encouraged pt to get appt ASAP. Call [phone] if SI or go to [facility].    This note was prepared with assistance of Conservation officer, historic buildings. Occasional wrong-word or sound-a-like substitutions may have occurred due to the inherent limitations of voice recognition software.  Time Spent: 35 minutes of total time was spent on the date of the encounter performing the following actions: chart review prior to seeing the patient, obtaining history, performing a medically necessary exam, counseling on the treatment plan, placing orders, and documenting  in our EHR.      Danitza Schoenfeldt M Elianis Fischbach, PA-C

## 2022-07-08 ENCOUNTER — Other Ambulatory Visit (HOSPITAL_COMMUNITY): Payer: Self-pay

## 2022-07-09 ENCOUNTER — Other Ambulatory Visit (HOSPITAL_BASED_OUTPATIENT_CLINIC_OR_DEPARTMENT_OTHER): Payer: Self-pay

## 2022-07-09 ENCOUNTER — Encounter: Payer: Self-pay | Admitting: Neurology

## 2022-07-09 ENCOUNTER — Ambulatory Visit (INDEPENDENT_AMBULATORY_CARE_PROVIDER_SITE_OTHER): Payer: No Typology Code available for payment source | Admitting: Neurology

## 2022-07-09 VITALS — BP 127/81 | HR 61 | Ht 67.0 in | Wt 172.0 lb

## 2022-07-09 DIAGNOSIS — Z820 Family history of epilepsy and other diseases of the nervous system: Secondary | ICD-10-CM | POA: Diagnosis not present

## 2022-07-09 DIAGNOSIS — R0681 Apnea, not elsewhere classified: Secondary | ICD-10-CM

## 2022-07-09 DIAGNOSIS — R0683 Snoring: Secondary | ICD-10-CM | POA: Diagnosis not present

## 2022-07-09 DIAGNOSIS — G478 Other sleep disorders: Secondary | ICD-10-CM

## 2022-07-09 DIAGNOSIS — G4700 Insomnia, unspecified: Secondary | ICD-10-CM

## 2022-07-09 NOTE — Patient Instructions (Signed)

## 2022-07-09 NOTE — Progress Notes (Signed)
Subjective:    Patient ID: Patrick Burnett is a 26 y.o. male.  HPI    Patrick Foley, MD, PhD [facility] [address] [address] [place], [place] [address]  Dear Patrick Burnett,   I saw your patient, Patrick Burnett, upon your kind request in my sleep clinic today for initial consultation of his sleep disorder, in particular, concern for underlying obstructive sleep apnea.  The patient is accompanied by his mother today.  As you know, Patrick Burnett is a 26 year old male with an underlying medical history of ADHD, anxiety, depression, congenital hearing loss on the left side, and overweight state, who reports some snoring and witnessed episodes of gasping at night.  He has chronic difficulty maintaining sleep for years, started when he was about 26 years old.  He had been on clonidine in the past which helped him fall asleep but is no longer on it.  He has not been on any prescription sleep aid but tried his mother's Xanax in the past.  He vapes marijuana which he uses at night.  Bedtime is generally around 11 and midnight, rise time around 8:20 AM.  He does have difficulty waking up in the mornings.  Epworth sleepiness score is 0 out of 24, fatigue severity score is 63 out of 63.  He had a sleep study as a child.  I reviewed your office note from 06/13/2022.  He had a sleep study some 10 years ago.  I reviewed his sleep study report from study dated 12/18/2011.  Study was interpreted by Dr. Jetty Burnett.  His sleep efficiency was 97.1%, sleep latency 4 minutes, REM latency delayed at 218.5 minutes.  REM percentage was reduced at 12.3%.  Total AHI was 1.5/h.  O2 nadir 89%.  His BMI was 20 at the time.   He has tried melatonin which does not help him stay asleep. He does not smoke cigarettes, he drinks alcohol very rarely.  He does not drink caffeine daily, maybe soda 3-4 times a week.  He has a history of sleep talking per girlfriend's feedback.  He reports sleeping well when he comes in  for sleep study.  At home he does not sleep well.  He tracks his sleep with his smart watch.  He feels that he gets mostly light stage sleep.  He has significant allergies.  He denies night to night nocturia or recurrent morning headaches.  Mom has sleep apnea and uses a PAP machine.  He has a TV in his bedroom but does not have it on at night.  He listens to something on his cell phone but then puts it away.  His Past Medical History Is Significant For: Past Medical History:  Diagnosis Date   ADHD (attention deficit hyperactivity disorder) 12/30/2012   Anxiety    Attention deficit hyperactivity disorder (ADHD)    Depression    Mood disorder (HCC)     His Past Surgical History Is Significant For: Past Surgical History:  Procedure Laterality Date   none      His Family History Is Significant For: Family History  Problem Relation Age of Onset   Hearing loss Mother    Hypercholesterolemia Mother    Anxiety disorder Mother    Sleep apnea Mother    Depression Mother    Depression Father    Diabetes Father    Hypercholesterolemia Father    Anxiety disorder Father    Hypertension Father    Depression Sister    Anxiety disorder  Sister    Learning disabilities Sister    Learning disabilities Brother    Hearing loss Maternal Grandmother    Diabetes Maternal Grandfather    Hearing loss Maternal Grandfather    Colon cancer Maternal Grandfather    Hearing loss Paternal Grandmother    Hearing loss Paternal Grandfather    Colon cancer Paternal Grandfather    Stomach cancer Neg Hx    Esophageal cancer Neg Hx    Rectal cancer Neg Hx    Liver cancer Neg Hx     His Social History Is Significant For: Social History   Socioeconomic History   Marital status: Single    Spouse name: Not on file   Number of children: 0   Years of education: 14   Highest education level: Some college, no degree  Occupational History   Occupation: Pensions consultant: Designer, jewellery  Tobacco Use    Smoking status: Never   Smokeless tobacco: Never  Vaping Use   Vaping Use: Every day  Substance and Sexual Activity   Alcohol use: Not Currently    Comment: OCC   Drug use: Yes    Comment: uses delta 8, 9, 10   Sexual activity: Not Currently  Other Topics Concern   Not on file  Social History Narrative   Lives with Dad in [place]. Playing video games, watching movies.    Social Determinants of Health   Financial Resource Strain: Not on file  Food Insecurity: Not on file  Transportation Needs: Not on file  Physical Activity: Not on file  Stress: Not on file  Social Connections: Not on file    His Allergies Are:  Allergies  Allergen Reactions   Amoxicillin Nausea And Vomiting   Penicillins    Risperidone And Related   :   His Current Medications Are:  Outpatient Encounter Medications as of 07/09/2022  Medication Sig   escitalopram (LEXAPRO) 10 MG tablet TAKE 1 TABLET BY MOUTH DAILY   fluticasone (FLONASE) 50 MCG/ACT nasal spray Place into both nostrils daily. (Patient not taking: Reported on 07/09/2022)   Multiple Vitamins-Minerals (MULTI ADULT GUMMIES) CHEW See admin instructions. (Patient not taking: Reported on 07/09/2022)   predniSONE (DELTASONE) 20 MG tablet Take 2 tablets (40 mg total) by mouth daily.   No facility-administered encounter medications on file as of 07/09/2022.  :  Review of Systems:  Out of a complete 14 point review of systems, all are reviewed and negative with the exception of these symptoms as listed below:   Review of Systems  Neurological:        Pt here for sleep consult Pt states fatigue,hypertension . Pt denies snoring ,headaches ,CPAP machine . Pt states that he has had 2 sleep studies when he was younger .     ESS:0 FSS:63    Objective:  Neurological Exam  Physical Exam Physical Examination:   Vitals:   07/09/22 0737  BP: 127/81  Pulse: 61    General Examination: The patient is a very pleasant 26 y.o. male in no acute  distress. He appears well-developed and well-nourished and well groomed.   HEENT: Normocephalic, atraumatic, pupils are equal, round and reactive to light, extraocular tracking is good without limitation to gaze excursion or nystagmus noted. Hearing is grossly intact. Face is symmetric with normal facial animation. Speech is clear with no dysarthria noted. There is no hypophonia. There is no lip, neck/head, jaw or voice tremor. Neck is supple with full range of passive and  active motion. There are no carotid bruits on auscultation. Oropharynx exam reveals: mild mouth dryness, adequate dental hygiene and mild airway crowding, due to small airway entry.  Mallampati class II.  Neck circumference 14-3/8 inches, tonsillar size of about 1+ bilaterally.  Tongue protrudes centrally and palate elevates symmetrically, minimal overbite.  Chest: Clear to auscultation without wheezing, rhonchi or crackles noted.  Heart: S1+S2+0, regular and normal without murmurs, rubs or gallops noted.   Abdomen: Soft, non-tender and non-distended.  Extremities: There is no pitting edema in the distal lower extremities bilaterally.   Skin: Warm and dry without trophic changes noted.   Musculoskeletal: exam reveals no obvious joint deformities.   Neurologically:  Mental status: The patient is awake, alert and oriented in all 4 spheres. His immediate and remote memory, attention, language skills and fund of knowledge are appropriate. There is no evidence of aphasia, agnosia, apraxia or anomia. Speech is clear with normal prosody and enunciation. Thought process is linear. Mood is normal and affect is normal.  Cranial nerves II - XII are as described above under HEENT exam.  Motor exam: Normal bulk, strength and tone is noted. There is no obvious tremor.  Fine motor skills and coordination: grossly intact.  Cerebellar testing: No dysmetria or intention tremor. There is no truncal or gait ataxia.  Sensory exam: intact to light  touch in the upper and lower extremities.  Gait, station and balance: He stands easily. No veering to one side is noted. No leaning to one side is noted. Posture is age-appropriate and stance is narrow based. Gait shows normal stride length and normal pace. No problems turning are noted.   Assessment and Plan:  In summary, Patrick Burnett is a very pleasant 26 y.o.-year old male with an underlying medical history of ADHD, anxiety, depression, congenital hearing loss on the left side, and overweight state, whose history and physical exam are concerning for sleep disordered breathing. I had a long chat with the patient and his mother about my findings and the diagnosis of sleep apnea, particularly OSA, its prognosis and treatment options. We talked about medical/conservative treatments, surgical interventions and non-pharmacological approaches for symptom control. I explained, in particular, the risks and ramifications of untreated moderate to severe OSA, especially with respect to developing cardiovascular disease down the road, including congestive heart failure (CHF), difficult to treat hypertension, cardiac arrhythmias (particularly A-fib), neurovascular complications including TIA, stroke and dementia. Even type 2 diabetes has, in part, been linked to untreated OSA. Symptoms of untreated OSA may include (but may not be limited to) daytime sleepiness, nocturia (i.e. frequent nighttime urination), memory problems, mood irritability and suboptimally controlled or worsening mood disorder such as depression and/or anxiety, lack of energy, lack of motivation, physical discomfort, as well as recurrent headaches, especially morning or nocturnal headaches. We talked about the importance of maintaining a healthy lifestyle and striving for healthy weight.  Since his last sleep study 10 years ago he has gained weight.   I recommended the following at this time: sleep study.  I outlined the differences between a  laboratory attended sleep study which is considered more comprehensive and accurate over the option of a home sleep test (HST); the latter may lead to underestimation of sleep disordered breathing in some instances and does not help with diagnosing upper airway resistance syndrome and is not accurate enough to diagnose primary central sleep apnea typically.  He is requesting to proceed with a home sleep test. I explained the different sleep test procedures  to the patient in detail and also outlined possible surgical and non-surgical treatment options of OSA, including the use of a pressure airway pressure (PAP) device (ie CPAP, AutoPAP/APAP or BiPAP in certain circumstances), a custom-made dental device (aka oral appliance, which would require a referral to a specialist dentist or orthodontist typically, and is generally speaking not considered a good choice for patients with full dentures or edentulous state), upper airway surgical options, such as traditional UPPP (which is not considered a first-line treatment) or the Inspire device (hypoglossal nerve stimulator, which would involve a referral for consultation with an ENT surgeon, after careful selection, following inclusion criteria). I explained the PAP treatment option to the patient in detail, as this is generally considered first-line treatment.  The patient indicated that he would be willing to try PAP therapy, if the need arises. I explained the importance of being compliant with PAP treatment, not only for insurance purposes but primarily to improve patient's symptoms symptoms, and for the patient's long term health benefit, including to reduce His cardiovascular risks longer-term.    We will pick up our discussion about the next steps and treatment options after testing.  We will keep him posted as to the test results by phone call and/or MyChart messaging where possible.  We will plan to follow-up in sleep clinic accordingly as well.  I answered all  his questions today and the patient and his mother were in agreement.   I encouraged him to call with any interim questions, concerns, problems or updates or email [place] through [place].  Generally speaking, sleep test authorizations may take up to 2 weeks, sometimes less, sometimes longer, the patient is encouraged to get in touch with [place] if they do not hear back from the sleep lab staff directly within the next 2 weeks.  Thank you very much for allowing me to participate in the care of this nice patient. If I can be of any further assistance to you please do not hesitate to call me at [phone].  Sincerely,   Star Age, MD, PhD

## 2022-07-14 ENCOUNTER — Ambulatory Visit: Payer: No Typology Code available for payment source | Admitting: Behavioral Health

## 2022-07-17 ENCOUNTER — Other Ambulatory Visit (HOSPITAL_COMMUNITY): Payer: Self-pay

## 2022-07-21 ENCOUNTER — Other Ambulatory Visit (HOSPITAL_BASED_OUTPATIENT_CLINIC_OR_DEPARTMENT_OTHER): Payer: Self-pay

## 2022-07-21 ENCOUNTER — Encounter: Payer: Self-pay | Admitting: Behavioral Health

## 2022-07-21 ENCOUNTER — Ambulatory Visit (INDEPENDENT_AMBULATORY_CARE_PROVIDER_SITE_OTHER): Payer: No Typology Code available for payment source | Admitting: Behavioral Health

## 2022-07-21 DIAGNOSIS — F411 Generalized anxiety disorder: Secondary | ICD-10-CM | POA: Diagnosis not present

## 2022-07-21 DIAGNOSIS — F3341 Major depressive disorder, recurrent, in partial remission: Secondary | ICD-10-CM

## 2022-07-21 DIAGNOSIS — F331 Major depressive disorder, recurrent, moderate: Secondary | ICD-10-CM | POA: Diagnosis not present

## 2022-07-21 MED ORDER — ESCITALOPRAM OXALATE 20 MG PO TABS
20.0000 mg | ORAL_TABLET | Freq: Every day | ORAL | 3 refills | Status: DC
  Filled 2022-07-21: qty 30, 30d supply, fill #0
  Filled 2022-08-21: qty 30, 30d supply, fill #1

## 2022-07-21 NOTE — Progress Notes (Signed)
[facility]  Patient ID: Patrick Burnett,  MRN: [id number]  PCP: Vivi Barrack, MD  Date of Evaluation: 07/21/2022 Time spent:30 minutes  Chief Complaint:  Chief Complaint   Anxiety; Depression; Follow-up; Medication Refill; Medication Problem; Patient Education     HISTORY/CURRENT STATUS: HPI  26 year old male presents to this office for follow up and medication refill. He says he is currently doing great. Says that he had a rough couple of months. Says his mental breakdown were occurring due to his job and financial stress. He says that he is fine right now. He is getting back on track and has a plan in place for financial recovery. He would like to consider increasing his Lexapro and agrees to take as prescribed. He acknowledges he was not taking his meds consistently last time.Marland Kitchen He is sleeping 7-8 hours per night. He agrees to 2 months follow up. No mania, No current SI or HI.    No past psychiatric medication failures noted   Individual Medical History/ Review of Systems: Changes? :No   Allergies: Amoxicillin, Penicillins, and Risperidone and related  Current Medications:  Current Outpatient Medications:    escitalopram (LEXAPRO) 20 MG tablet, Take 1 tablet (20 mg total) by mouth daily., Disp: 30 tablet, Rfl: 3   escitalopram (LEXAPRO) 10 MG tablet, TAKE 1 TABLET BY MOUTH DAILY, Disp: 30 tablet, Rfl: 1   fluticasone (FLONASE) 50 MCG/ACT nasal spray, Place into both nostrils daily. (Patient not taking: Reported on 07/09/2022), Disp: , Rfl:    Multiple Vitamins-Minerals (MULTI ADULT GUMMIES) CHEW, See admin instructions. (Patient not taking: Reported on 07/09/2022), Disp: , Rfl:    predniSONE (DELTASONE) 20 MG tablet, Take 2 tablets (40 mg total) by mouth daily., Disp: 10 tablet, Rfl: 0 Medication Side Effects: none  Family Medical/ Social History: Changes? No  MENTAL HEALTH EXAM:  There were no vitals taken for this visit.There is no height or weight on file to  calculate BMI.  General Appearance: Casual, Neat, and Well Groomed  Eye Contact:  Good  Speech:  Clear and Coherent  Volume:  Normal  Mood:  NA and Anxious  Affect:  Appropriate  Thought Process:  Coherent  Orientation:  Full (Time, Place, and Person)  Thought Content: Logical   Suicidal Thoughts:  No  Homicidal Thoughts:  No  Memory:  WNL  Judgement:  Good  Insight:  Good  Psychomotor Activity:  Normal  Concentration:  Concentration: Good  Recall:  Good  Fund of Knowledge: Good  Language: Good  Assets:  Desire for Improvement  ADL's:  Intact  Cognition: WNL  Prognosis:  Good    DIAGNOSES:    ICD-10-CM   1. Generalized anxiety disorder  F41.1 escitalopram (LEXAPRO) 20 MG tablet    2. Recurrent major depression in partial remission (HCC)  F33.41 escitalopram (LEXAPRO) 20 MG tablet    3. Major depressive disorder, recurrent episode, moderate (HCC)  F33.1 escitalopram (LEXAPRO) 20 MG tablet      Receiving Psychotherapy: No    RECOMMENDATIONS:    RECOMMENDATIONS:   To increase  Lexapro to 20 mg.   Will follow up in 2 months to reevaluate progress. Will report any side effects or worsening symptoms.  Will continue psychotherapy Greater than 50% of  30  min face to face time with patient was spent on counseling and coordination of care. We discussed his instability the last couple of months but he admits that he was not taking his medication consistently. He says that  most of his problems came from increased financial stress. He would like to consider increasing his Lexapro today. I reinforced importance of taking medication every day as prescribed.    He agreed to contact office sooner if condition changes.          Elwanda Brooklyn, NP

## 2022-07-23 ENCOUNTER — Telehealth: Payer: Self-pay | Admitting: Neurology

## 2022-07-23 NOTE — Telephone Encounter (Signed)
I called pt and relayed that he is inquiring about sleep test.  I relayed that will forward message about this to sleep lab.  It can take several weeks to get auth.  He verbalized understanding.

## 2022-07-23 NOTE — Telephone Encounter (Signed)
Pt called wanting to know when he will be called with his HST results. Please advise.

## 2022-07-28 ENCOUNTER — Encounter: Payer: Self-pay | Admitting: *Deleted

## 2022-07-31 NOTE — Telephone Encounter (Signed)
[facility] no auth req ref # [id number]    left VM for mailout HST 07/31/22 [place]

## 2022-08-01 ENCOUNTER — Encounter: Payer: Self-pay | Admitting: Family Medicine

## 2022-08-01 NOTE — Telephone Encounter (Signed)
Please see patient message and advise if you would like referral placed

## 2022-08-04 NOTE — Telephone Encounter (Signed)
MAIL OUT- 08/06/22 HST- [facility] no auth req ref # [id number]

## 2022-08-05 ENCOUNTER — Other Ambulatory Visit: Payer: Self-pay

## 2022-08-05 DIAGNOSIS — H9192 Unspecified hearing loss, left ear: Secondary | ICD-10-CM

## 2022-08-05 DIAGNOSIS — J329 Chronic sinusitis, unspecified: Secondary | ICD-10-CM

## 2022-08-05 DIAGNOSIS — J029 Acute pharyngitis, unspecified: Secondary | ICD-10-CM

## 2022-08-05 DIAGNOSIS — H6691 Otitis media, unspecified, right ear: Secondary | ICD-10-CM

## 2022-08-05 NOTE — Telephone Encounter (Signed)
Caller declined OV for cochlear implant referral  Specialty: Requesting to go to [facility] in [place] and Ander Purpura

## 2022-08-05 NOTE — Telephone Encounter (Signed)
Preview note send to PCP

## 2022-08-05 NOTE — Telephone Encounter (Signed)
Ok to send referral.  Algis Greenhouse. Jerline Pain, MD 08/05/2022 12:32 PM

## 2022-08-06 ENCOUNTER — Ambulatory Visit: Payer: No Typology Code available for payment source | Admitting: Neurology

## 2022-08-06 DIAGNOSIS — Z820 Family history of epilepsy and other diseases of the nervous system: Secondary | ICD-10-CM

## 2022-08-06 DIAGNOSIS — G478 Other sleep disorders: Secondary | ICD-10-CM

## 2022-08-06 DIAGNOSIS — R0681 Apnea, not elsewhere classified: Secondary | ICD-10-CM

## 2022-08-06 DIAGNOSIS — R0683 Snoring: Secondary | ICD-10-CM

## 2022-08-06 DIAGNOSIS — G4700 Insomnia, unspecified: Secondary | ICD-10-CM

## 2022-08-18 NOTE — Telephone Encounter (Signed)
repeat from 08/06/22 mail out device due to no oxygen reading HST- [facility] no auth req ref # [id number].  Patient is scheduled for 08/19/22 at [place] at 4 pm.

## 2022-08-21 ENCOUNTER — Other Ambulatory Visit (HOSPITAL_BASED_OUTPATIENT_CLINIC_OR_DEPARTMENT_OTHER): Payer: Self-pay

## 2022-09-15 ENCOUNTER — Ambulatory Visit (INDEPENDENT_AMBULATORY_CARE_PROVIDER_SITE_OTHER): Payer: Self-pay | Admitting: Behavioral Health

## 2022-09-15 DIAGNOSIS — F489 Nonpsychotic mental disorder, unspecified: Secondary | ICD-10-CM

## 2022-09-15 NOTE — Progress Notes (Signed)
Patient did not show for scheduled appt and did not provide 24 hour notice as required by policy. Fees to be assessed.

## 2022-09-24 ENCOUNTER — Ambulatory Visit (INDEPENDENT_AMBULATORY_CARE_PROVIDER_SITE_OTHER): Payer: No Typology Code available for payment source | Admitting: Behavioral Health

## 2022-09-24 ENCOUNTER — Encounter: Payer: Self-pay | Admitting: Behavioral Health

## 2022-09-24 ENCOUNTER — Other Ambulatory Visit (HOSPITAL_BASED_OUTPATIENT_CLINIC_OR_DEPARTMENT_OTHER): Payer: Self-pay

## 2022-09-24 DIAGNOSIS — F411 Generalized anxiety disorder: Secondary | ICD-10-CM | POA: Diagnosis not present

## 2022-09-24 DIAGNOSIS — F3341 Major depressive disorder, recurrent, in partial remission: Secondary | ICD-10-CM

## 2022-09-24 DIAGNOSIS — F331 Major depressive disorder, recurrent, moderate: Secondary | ICD-10-CM

## 2022-09-24 MED ORDER — ESCITALOPRAM OXALATE 20 MG PO TABS
20.0000 mg | ORAL_TABLET | Freq: Every day | ORAL | 3 refills | Status: DC
  Filled 2022-09-24: qty 30, 30d supply, fill #0
  Filled 2022-10-30: qty 30, 30d supply, fill #1
  Filled 2022-12-14: qty 30, 30d supply, fill #2
  Filled 2023-01-14: qty 30, 30d supply, fill #3

## 2022-09-24 NOTE — Progress Notes (Signed)
[facility]  Patient ID: Patrick Burnett,  MRN: [id number]  PCP: Ardith Dark, MD  Date of Evaluation: 09/24/2022 Time spent:20 minutes  Chief Complaint:  Chief Complaint   Depression; Anxiety; Follow-up; ADHD; Medication Refill; Patient Education     HISTORY/CURRENT STATUS: HPI 26 year old male presents to this office for follow up and medication refill. He says he is currently doing great. He is smiling and pleasant today. Expressed how much better he feel right now.  He says that he is taking his Lexapro 20 mg daily and is consistent with administration. He is happy with the medication and is requesting no changes this visit.  He is sleeping 7-8 hours per night. He agrees to 2 months follow up. No mania, No current SI or HI.    No past psychiatric medication failures noted    Individual Medical History/ Review of Systems: Changes? :No   Allergies: Amoxicillin, Penicillins, and Risperidone and related  Current Medications:  Current Outpatient Medications:    escitalopram (LEXAPRO) 10 MG tablet, TAKE 1 TABLET BY MOUTH DAILY, Disp: 30 tablet, Rfl: 1   escitalopram (LEXAPRO) 20 MG tablet, Take 1 tablet (20 mg total) by mouth daily., Disp: 30 tablet, Rfl: 3   fluticasone (FLONASE) 50 MCG/ACT nasal spray, Place into both nostrils daily. (Patient not taking: Reported on 07/09/2022), Disp: , Rfl:    Multiple Vitamins-Minerals (MULTI ADULT GUMMIES) CHEW, See admin instructions. (Patient not taking: Reported on 07/09/2022), Disp: , Rfl:    predniSONE (DELTASONE) 20 MG tablet, Take 2 tablets (40 mg total) by mouth daily., Disp: 10 tablet, Rfl: 0 Medication Side Effects: none  Family Medical/ Social History: Changes? No  MENTAL HEALTH EXAM:  There were no vitals taken for this visit.There is no height or weight on file to calculate BMI.  General Appearance: Casual, Neat, and Well Groomed  Eye Contact:  Good  Speech:  Clear and Coherent  Volume:  Normal  Mood:  NA   Affect:  Appropriate  Thought Process:  Coherent  Orientation:  Full (Time, Place, and Person)  Thought Content: Logical   Suicidal Thoughts:  No  Homicidal Thoughts:  No  Memory:  WNL  Judgement:  Good  Insight:  Good  Psychomotor Activity:  Normal  Concentration:  Concentration: Good  Recall:  Good  Fund of Knowledge: Good  Language: Good  Assets:  Desire for Improvement  ADL's:  Intact  Cognition: WNL  Prognosis:  Good    DIAGNOSES:    ICD-10-CM   1. Major depressive disorder, recurrent episode, moderate (HCC)  F33.1 escitalopram (LEXAPRO) 20 MG tablet    2. Generalized anxiety disorder  F41.1 escitalopram (LEXAPRO) 20 MG tablet    3. Recurrent major depression in partial remission (HCC)  F33.41 escitalopram (LEXAPRO) 20 MG tablet      Receiving Psychotherapy: No    RECOMMENDATIONS:  To increase  Lexapro to 20 mg.   Will follow up in 3 months to reevaluate progress. Will report any side effects or worsening symptoms.  Will continue psychotherapy Provided emergency contact information  Greater than 50% of  30  min face to face time with patient was spent on counseling and coordination of care. We discussed his compliance with meds since last visit has been helping him to feel better. He says that most of his problems came from increased financial stress.       Joan Flores, NP

## 2022-10-15 ENCOUNTER — Ambulatory Visit (INDEPENDENT_AMBULATORY_CARE_PROVIDER_SITE_OTHER): Payer: No Typology Code available for payment source | Admitting: Family

## 2022-10-15 ENCOUNTER — Encounter: Payer: Self-pay | Admitting: Family

## 2022-10-15 VITALS — BP 123/80 | HR 86 | Temp 97.5°F | Ht 67.0 in | Wt 171.0 lb

## 2022-10-15 DIAGNOSIS — J069 Acute upper respiratory infection, unspecified: Secondary | ICD-10-CM

## 2022-10-15 LAB — POCT INFLUENZA A/B
Influenza A, POC: NEGATIVE
Influenza B, POC: NEGATIVE

## 2022-10-15 NOTE — Progress Notes (Signed)
Patient ID: Patrick Burnett, male    DOB: 10-17-1996, 26 y.o.   MRN: [id number]  Chief Complaint  Patient presents with   Cough    sx for 2d   HPI:      URI sx:  Pt c/o sore throat, cough, Nasal. Congestion/drainage(green) and fatigue since Monday.  Covid negative on 12/11. Denies known fever, but has felt flushed yesterday & day before. Has tried vapor rub, Robitussin and generic sudafed decongestant.       Assessment & Plan:  1. Viral upper respiratory tract infection - rapid flu neg. Advised to continue the sudafed for a few more days only, use Ibuprofen up to 600mg  for aches pains, sinus pressure.  Use nasal saline spray tid, humidifier overnight. Increase water intake to at least 2 liters qd.    - POCT Influenza A/B  Subjective:    Outpatient Medications Prior to Visit  Medication Sig Dispense Refill   escitalopram (LEXAPRO) 20 MG tablet Take 1 tablet (20 mg total) by mouth daily. 30 tablet 3   Multiple Vitamins-Minerals (MULTI ADULT GUMMIES) CHEW See admin instructions.     No facility-administered medications prior to visit.   Past Medical History:  Diagnosis Date   ADHD (attention deficit hyperactivity disorder) 12/30/2012   Anxiety    Attention deficit hyperactivity disorder (ADHD)    Depression    Mood disorder (HCC)    Past Surgical History:  Procedure Laterality Date   none     Allergies  Allergen Reactions   Amoxicillin Nausea And Vomiting   Penicillins    Risperidone And Related       Objective:    Physical Exam Vitals and nursing note reviewed.  Constitutional:      General: He is not in acute distress.    Appearance: Normal appearance.  HENT:     Head: Normocephalic.     Right Ear: Tympanic membrane and ear canal normal.     Left Ear: Tympanic membrane and ear canal normal.     Nose:     Right Sinus: Frontal sinus tenderness present. No maxillary sinus tenderness.     Left Sinus: Frontal sinus tenderness present. No maxillary sinus  tenderness.     Mouth/Throat:     Mouth: Mucous membranes are moist.     Pharynx: No pharyngeal swelling, oropharyngeal exudate, posterior oropharyngeal erythema or uvula swelling.     Tonsils: No tonsillar exudate or tonsillar abscesses.  Cardiovascular:     Rate and Rhythm: Normal rate and regular rhythm.  Pulmonary:     Effort: Pulmonary effort is normal.     Breath sounds: Normal breath sounds.  Musculoskeletal:        General: Normal range of motion.     Cervical back: Normal range of motion.  Lymphadenopathy:     Head:     Right side of head: No preauricular or posterior auricular adenopathy.     Left side of head: No preauricular or posterior auricular adenopathy.     Cervical: No cervical adenopathy.  Skin:    General: Skin is warm and dry.  Neurological:     Mental Status: He is alert and oriented to person, place, and time.  Psychiatric:        Mood and Affect: Mood normal.    BP 123/80 (BP Location: Left Arm, Patient Position: Sitting, Cuff Size: Large)   Pulse 86   Temp (!) 97.5 F (36.4 C) (Temporal)   Ht 5\' 7"  (1.702 m)   Wt  171 lb (77.6 kg)   SpO2 95%   BMI 26.78 kg/m  Wt Readings from Last 3 Encounters:  10/15/22 171 lb (77.6 kg)  07/09/22 172 lb (78 kg)  06/13/22 167 lb 12.8 oz (76.1 kg)      Dulce Sellar, NP

## 2022-10-16 ENCOUNTER — Encounter: Payer: Self-pay | Admitting: *Deleted

## 2022-10-30 ENCOUNTER — Other Ambulatory Visit (HOSPITAL_BASED_OUTPATIENT_CLINIC_OR_DEPARTMENT_OTHER): Payer: Self-pay

## 2022-11-06 DIAGNOSIS — H9042 Sensorineural hearing loss, unilateral, left ear, with unrestricted hearing on the contralateral side: Secondary | ICD-10-CM | POA: Diagnosis not present

## 2022-11-11 ENCOUNTER — Other Ambulatory Visit (HOSPITAL_COMMUNITY): Payer: Self-pay | Admitting: Otolaryngology

## 2022-11-11 DIAGNOSIS — H90A22 Sensorineural hearing loss, unilateral, left ear, with restricted hearing on the contralateral side: Secondary | ICD-10-CM

## 2022-11-11 DIAGNOSIS — H9042 Sensorineural hearing loss, unilateral, left ear, with unrestricted hearing on the contralateral side: Secondary | ICD-10-CM

## 2022-11-11 DIAGNOSIS — H918X3 Other specified hearing loss, bilateral: Secondary | ICD-10-CM

## 2022-11-17 ENCOUNTER — Encounter: Payer: Self-pay | Admitting: Family Medicine

## 2022-11-17 ENCOUNTER — Other Ambulatory Visit (HOSPITAL_BASED_OUTPATIENT_CLINIC_OR_DEPARTMENT_OTHER): Payer: Self-pay

## 2022-11-17 ENCOUNTER — Ambulatory Visit (INDEPENDENT_AMBULATORY_CARE_PROVIDER_SITE_OTHER): Payer: Commercial Managed Care - PPO | Admitting: Family Medicine

## 2022-11-17 VITALS — BP 128/85 | HR 86 | Temp 98.2°F | Ht 67.0 in | Wt 171.5 lb

## 2022-11-17 DIAGNOSIS — J329 Chronic sinusitis, unspecified: Secondary | ICD-10-CM | POA: Diagnosis not present

## 2022-11-17 MED ORDER — AZITHROMYCIN 250 MG PO TABS
ORAL_TABLET | ORAL | 0 refills | Status: AC
  Filled 2022-11-17: qty 6, 5d supply, fill #0

## 2022-11-17 MED ORDER — PREDNISONE 20 MG PO TABS
40.0000 mg | ORAL_TABLET | Freq: Every day | ORAL | 0 refills | Status: AC
  Filled 2022-11-17: qty 10, 5d supply, fill #0

## 2022-11-17 NOTE — Patient Instructions (Signed)
Let us know if not improving or worsening  Nasal saline  Finish zpack, can stop prednisone early if doing well

## 2022-11-17 NOTE — Progress Notes (Signed)
Subjective:     Patient ID: Patrick Burnett, male    DOB: 12-Mar-1996, 27 y.o.   MRN: [id number]  Chief Complaint  Patient presents with   Sinusitis    Hx of sinus issues, concerned that blood was in sinus drainage after massaging sinuses, sx started Friday, seem to be getting worse Taking decongestant     HPI H/o freq sinus issues.  Started on 1/12 so massaging sinuses.  Pain/pressure getting worse. A lot of otc, not helping.  some blood in congestion last wk and this am.  Getting worse.  No f/c.  No HA.  + sinus pressure R>L and teeth hurt as well.  Last episode December.  Used to get every few months, but not as much.  No abx in Dec.  Zpk works well for him.    There are no preventive care reminders to display for this patient.  Past Medical History:  Diagnosis Date   ADHD (attention deficit hyperactivity disorder) 12/30/2012   Anxiety    Attention deficit hyperactivity disorder (ADHD)    Depression    Mood disorder ([place])     Past Surgical History:  Procedure Laterality Date   none      Outpatient Medications Prior to Visit  Medication Sig Dispense Refill   escitalopram (LEXAPRO) 20 MG tablet Take 1 tablet (20 mg total) by mouth daily. 30 tablet 3   Multiple Vitamins-Minerals (MULTI ADULT GUMMIES) CHEW See admin instructions.     No facility-administered medications prior to visit.    Allergies  Allergen Reactions   Amoxicillin Nausea And Vomiting   Penicillins    Risperidone Swelling   Risperidone And Related    ROS neg/noncontributory except as noted HPI/below      Objective:     BP 128/85   Pulse 86   Temp 98.2 F (36.8 C) (Temporal)   Ht 5\' 7"  (1.702 m)   Wt 171 lb 8 oz (77.8 kg)   SpO2 94%   BMI 26.86 kg/m  Wt Readings from Last 3 Encounters:  11/17/22 171 lb 8 oz (77.8 kg)  10/15/22 171 lb (77.6 kg)  07/09/22 172 lb (78 kg)    Physical Exam   Gen: WDWN NAD HEENT: NCAT, conjunctiva not injected, sclera nonicteric TM WNL B, OP moist, no  exudates   some blood R nare.  Sinuses tender R>L NECK:  supple, no thyromegaly, no nodes, CARDIAC: RRR, S1S2+, no murmur.  LUNGS: CTAB. No wheezes EXT:  no edema MSK: no gross abnormalities.  NEURO: A&O x3.  CN II-XII intact.  PSYCH: normal mood. Good eye contact     Assessment & Plan:   Problem List Items Addressed This Visit   None Visit Diagnoses     Recurrent sinusitis    -  Primary   Relevant Medications   azithromycin (ZITHROMAX) 250 MG tablet   predniSONE (DELTASONE) 20 MG tablet      Sinusitis-recurrant.  Zpk, pred 40mg  daily, saline.    Meds ordered this encounter  Medications   azithromycin (ZITHROMAX) 250 MG tablet    Sig: Take 2 tablets by mouth on day 1, then 1 tablet daily on days 2 through 5    Dispense:  6 tablet    Refill:  0   predniSONE (DELTASONE) 20 MG tablet    Sig: Take 2 tablets (40 mg total) by mouth daily with breakfast for 5 days.    Dispense:  10 tablet    Refill:  0    Breon Rehm  Terrence Dupont, MD

## 2022-11-24 ENCOUNTER — Other Ambulatory Visit (HOSPITAL_BASED_OUTPATIENT_CLINIC_OR_DEPARTMENT_OTHER): Payer: Self-pay

## 2022-11-24 ENCOUNTER — Encounter: Payer: Self-pay | Admitting: Behavioral Health

## 2022-11-24 ENCOUNTER — Ambulatory Visit (INDEPENDENT_AMBULATORY_CARE_PROVIDER_SITE_OTHER): Payer: Commercial Managed Care - PPO | Admitting: Behavioral Health

## 2022-11-24 DIAGNOSIS — F902 Attention-deficit hyperactivity disorder, combined type: Secondary | ICD-10-CM | POA: Diagnosis not present

## 2022-11-24 MED ORDER — AMPHETAMINE-DEXTROAMPHET ER 20 MG PO CP24
20.0000 mg | ORAL_CAPSULE | Freq: Every day | ORAL | 0 refills | Status: DC
  Filled 2022-11-24: qty 30, 30d supply, fill #0

## 2022-11-24 NOTE — Progress Notes (Signed)
[facility]  Patient ID: Patrick Burnett,  MRN: [id number]  PCP: Vivi Barrack, MD  Date of Evaluation: 11/24/2022 Time spent:30 minutes  Chief Complaint:  Chief Complaint   Depression; Anxiety; ADHD; Patient Education; Medication Refill; Medication Problem     HISTORY/CURRENT STATUS: HPI 27 year old male presents to this office for follow up and medication refill. He says he is currently doing great. He is smiling and pleasant today.  He says that he is taking his Lexapro 20 mg daily and is consistent with administration. He is wanting to consider a restart of ADHD medication as discussed last visit. Says he is back in school full time and is going to need assistance. He tried to stay off the med for a few years but says it is just to hard in school. H He is sleeping 7-8 hours per night. He agrees to 2 months follow up. No mania, No current SI or HI.    No past psychiatric medication: Vyvanse Concerta Methylphenidate        Individual Medical History/ Review of Systems: Changes? :No   Allergies: Amoxicillin, Penicillins, Risperidone, and Risperidone and related  Current Medications:  Current Outpatient Medications:    escitalopram (LEXAPRO) 20 MG tablet, Take 1 tablet (20 mg total) by mouth daily., Disp: 30 tablet, Rfl: 3   Multiple Vitamins-Minerals (MULTI ADULT GUMMIES) CHEW, See admin instructions., Disp: , Rfl:  Medication Side Effects: none  Family Medical/ Social History: Changes? No  MENTAL HEALTH EXAM:  There were no vitals taken for this visit.There is no height or weight on file to calculate BMI.  General Appearance: Casual, Neat, and Well Groomed  Eye Contact:  Good  Speech:  Clear and Coherent  Volume:  Normal  Mood:  NA  Affect:  Appropriate  Thought Process:  Coherent  Orientation:  Full (Time, Place, and Person)  Thought Content: Logical   Suicidal Thoughts:  No  Homicidal Thoughts:  No  Memory:  WNL  Judgement:  Good  Insight:   Good  Psychomotor Activity:  Normal  Concentration:  Concentration: Good  Recall:  Good  Fund of Knowledge: Good  Language: Good  Assets:  Desire for Improvement  ADL's:  Intact  Cognition: WNL  Prognosis:  Good    DIAGNOSES:    ICD-10-CM   1. Attention deficit hyperactivity disorder (ADHD), combined type  F90.2       Receiving Psychotherapy: No    RECOMMENDATIONS:   Greater than 50% of  30  min face to face time with patient was spent on counseling and coordination of care. We discussed his compliance with meds since last visit has been helping him to feel better. He says that most of his problems came from increased financial stress. We also discussed his extensive history of ADHD since early childhood. He tried to stay off medication but now is back in school and is requesting restart. To start Adderall 20 XR daily in the am after breakfast Continue Lexapro to 20 mg.   Will follow up in 4 weeks to reevaluate progress. Will report any side effects or worsening symptoms.  Will continue psychotherapy Provided emergency contact information  Reviewed PDMP     Elwanda Brooklyn, NP

## 2022-11-25 ENCOUNTER — Other Ambulatory Visit (HOSPITAL_BASED_OUTPATIENT_CLINIC_OR_DEPARTMENT_OTHER): Payer: Self-pay

## 2022-12-01 ENCOUNTER — Ambulatory Visit (HOSPITAL_COMMUNITY)
Admission: RE | Admit: 2022-12-01 | Discharge: 2022-12-01 | Disposition: A | Discharge status: Home / Self Care | Payer: Commercial Managed Care - PPO | Source: Ambulatory Visit | Attending: Otolaryngology | Admitting: Otolaryngology

## 2022-12-01 DIAGNOSIS — H918X3 Other specified hearing loss, bilateral: Secondary | ICD-10-CM

## 2022-12-01 DIAGNOSIS — J3489 Other specified disorders of nose and nasal sinuses: Secondary | ICD-10-CM | POA: Diagnosis not present

## 2022-12-01 DIAGNOSIS — H9042 Sensorineural hearing loss, unilateral, left ear, with unrestricted hearing on the contralateral side: Secondary | ICD-10-CM

## 2022-12-01 DIAGNOSIS — J329 Chronic sinusitis, unspecified: Secondary | ICD-10-CM | POA: Diagnosis not present

## 2022-12-01 DIAGNOSIS — H90A22 Sensorineural hearing loss, unilateral, left ear, with restricted hearing on the contralateral side: Secondary | ICD-10-CM

## 2022-12-01 DIAGNOSIS — H9041 Sensorineural hearing loss, unilateral, right ear, with unrestricted hearing on the contralateral side: Secondary | ICD-10-CM | POA: Diagnosis not present

## 2022-12-01 DIAGNOSIS — J341 Cyst and mucocele of nose and nasal sinus: Secondary | ICD-10-CM | POA: Diagnosis not present

## 2022-12-01 MED ORDER — GADOBUTROL 1 MMOL/ML IV SOLN
7.5000 mL | Freq: Once | INTRAVENOUS | Status: AC | PRN
  Administered 2022-12-01: 7.5 mL via INTRAVENOUS

## 2022-12-14 ENCOUNTER — Other Ambulatory Visit (HOSPITAL_BASED_OUTPATIENT_CLINIC_OR_DEPARTMENT_OTHER): Payer: Self-pay

## 2022-12-14 ENCOUNTER — Other Ambulatory Visit: Payer: Self-pay | Admitting: Behavioral Health

## 2022-12-14 DIAGNOSIS — F902 Attention-deficit hyperactivity disorder, combined type: Secondary | ICD-10-CM

## 2022-12-15 ENCOUNTER — Other Ambulatory Visit: Payer: Self-pay

## 2022-12-15 ENCOUNTER — Other Ambulatory Visit (HOSPITAL_BASED_OUTPATIENT_CLINIC_OR_DEPARTMENT_OTHER): Payer: Self-pay

## 2022-12-15 MED ORDER — AMPHETAMINE-DEXTROAMPHET ER 20 MG PO CP24
20.0000 mg | ORAL_CAPSULE | Freq: Every day | ORAL | 0 refills | Status: DC
  Filled 2022-12-15 – 2023-01-15 (×3): qty 30, 30d supply, fill #0

## 2022-12-22 ENCOUNTER — Encounter: Payer: Self-pay | Admitting: Behavioral Health

## 2022-12-22 ENCOUNTER — Ambulatory Visit: Payer: Commercial Managed Care - PPO | Admitting: Behavioral Health

## 2022-12-22 DIAGNOSIS — F902 Attention-deficit hyperactivity disorder, combined type: Secondary | ICD-10-CM | POA: Diagnosis not present

## 2022-12-22 DIAGNOSIS — F331 Major depressive disorder, recurrent, moderate: Secondary | ICD-10-CM

## 2022-12-22 DIAGNOSIS — F411 Generalized anxiety disorder: Secondary | ICD-10-CM

## 2022-12-22 NOTE — Progress Notes (Signed)
[facility]  Patient ID: Patrick Burnett,  MRN: [id number]  PCP: Vivi Barrack, MD  Date of Evaluation: 12/22/2022 Time spent:30 minutes  Chief Complaint:  Chief Complaint   ADHD; Anxiety; Follow-up; Patient Education; Depression     HISTORY/CURRENT STATUS: HPI 26 year old male presents to this office for follow up and medication refill. He says he is currently doing great. He is smiling and pleasant today.  He says that he is taking his Lexapro 20 mg daily and is consistent with administration. Adderall is working very well. Request no changes this visit.  He is sleeping 7-8 hours per night. He agrees to 2 months follow up. No mania, No current SI or HI.    No past psychiatric medication: Vyvanse Concerta Methylphenidate Individual Medical History/ Review of Systems: Changes? :No   Allergies: Amoxicillin, Penicillins, Risperidone, and Risperidone and related  Current Medications:  Current Outpatient Medications:    amphetamine-dextroamphetamine (ADDERALL XR) 20 MG 24 hr capsule, Take 1 capsule (20 mg total) by mouth daily., Disp: 30 capsule, Rfl: 0   escitalopram (LEXAPRO) 20 MG tablet, Take 1 tablet (20 mg total) by mouth daily., Disp: 30 tablet, Rfl: 3   Multiple Vitamins-Minerals (MULTI ADULT GUMMIES) CHEW, See admin instructions., Disp: , Rfl:  Medication Side Effects: none  Family Medical/ Social History: Changes? No  MENTAL HEALTH EXAM:  There were no vitals taken for this visit.There is no height or weight on file to calculate BMI.  General Appearance: Casual, Neat, and Well Groomed  Eye Contact:  Good  Speech:  Clear and Coherent  Volume:  Normal  Mood:  NA  Affect:  Appropriate  Thought Process:  Coherent  Orientation:  Full (Time, Place, and Person)  Thought Content: Logical   Suicidal Thoughts:  No  Homicidal Thoughts:  No  Memory:  WNL  Judgement:  Good  Insight:  Good  Psychomotor Activity:  Normal  Concentration:  Concentration: Good   Recall:  Good  Fund of Knowledge: Good  Language: Good  Assets:  Desire for Improvement  ADL's:  Intact  Cognition: WNL  Prognosis:  Good    DIAGNOSES:    ICD-10-CM   1. Attention deficit hyperactivity disorder (ADHD), combined type  F90.2     2. Major depressive disorder, recurrent episode, moderate (HCC)  F33.1     3. Generalized anxiety disorder  F41.1       Receiving Psychotherapy: No    RECOMMENDATIONS:   Greater than 50% of  30  min face to face time with patient was spent on counseling and coordination of care. He is very happy with his medication right now. Says that he is doing well and requesting no changes this visit.  To start Adderall 20 XR daily in the am after breakfast Continue Lexapro to 20 mg.   Will follow up in 3 months to reevaluate progress. Will report any side effects or worsening symptoms.  Will continue psychotherapy Provided emergency contact information  Reviewed PDMP       Elwanda Brooklyn, NP

## 2023-01-14 ENCOUNTER — Ambulatory Visit (INDEPENDENT_AMBULATORY_CARE_PROVIDER_SITE_OTHER): Payer: Commercial Managed Care - PPO | Admitting: Behavioral Health

## 2023-01-14 ENCOUNTER — Other Ambulatory Visit (HOSPITAL_BASED_OUTPATIENT_CLINIC_OR_DEPARTMENT_OTHER): Payer: Self-pay

## 2023-01-14 ENCOUNTER — Encounter: Payer: Self-pay | Admitting: Behavioral Health

## 2023-01-14 ENCOUNTER — Other Ambulatory Visit (HOSPITAL_COMMUNITY): Payer: Self-pay

## 2023-01-14 ENCOUNTER — Other Ambulatory Visit: Payer: Self-pay

## 2023-01-14 DIAGNOSIS — F331 Major depressive disorder, recurrent, moderate: Secondary | ICD-10-CM

## 2023-01-14 DIAGNOSIS — F3341 Major depressive disorder, recurrent, in partial remission: Secondary | ICD-10-CM

## 2023-01-14 DIAGNOSIS — F411 Generalized anxiety disorder: Secondary | ICD-10-CM

## 2023-01-14 MED ORDER — ESCITALOPRAM OXALATE 20 MG PO TABS
20.0000 mg | ORAL_TABLET | Freq: Every day | ORAL | 3 refills | Status: DC
  Filled 2023-01-14 – 2023-01-15 (×2): qty 30, 30d supply, fill #0
  Filled 2023-02-11: qty 30, 30d supply, fill #1
  Filled 2023-03-11: qty 30, 30d supply, fill #2
  Filled 2023-04-02 – 2023-04-08 (×2): qty 30, 30d supply, fill #3

## 2023-01-14 NOTE — Progress Notes (Signed)
[facility]  Patient ID: Patrick Burnett,  MRN: [id number]  PCP: Vivi Barrack, MD  Date of Evaluation: 01/14/2023 Time spent:30 minutes  Chief Complaint:  Chief Complaint   Anxiety; Depression; Follow-up; Medication Refill; Patient Education     HISTORY/CURRENT STATUS: HPI 27 year old male presents to this office for follow up and medication refill. Mother is with him with his verbal consent today. Says that he skipped taking his Lexapro and started to become emotional and cry after two days of being off medication. Says he wants to be more compliant in taking his medication correctly. Adderall is working very well. Request no changes this visit.  He is sleeping 7-8 hours per night. He agrees to 4 week follow up. No mania, No current SI or HI.    No past psychiatric medication: Vyvanse Concerta Methylphenidate    Individual Medical History/ Review of Systems: Changes? :No   Allergies: Amoxicillin, Penicillins, Risperidone, and Risperidone and related  Current Medications:  Current Outpatient Medications:    amphetamine-dextroamphetamine (ADDERALL XR) 20 MG 24 hr capsule, Take 1 capsule (20 mg total) by mouth daily., Disp: 30 capsule, Rfl: 0   escitalopram (LEXAPRO) 20 MG tablet, Take 1 tablet (20 mg total) by mouth daily., Disp: 30 tablet, Rfl: 3   Multiple Vitamins-Minerals (MULTI ADULT GUMMIES) CHEW, See admin instructions., Disp: , Rfl:  Medication Side Effects: none  Family Medical/ Social History: Changes? No  MENTAL HEALTH EXAM:  There were no vitals taken for this visit.There is no height or weight on file to calculate BMI.  General Appearance: Casual, Neat, and Well Groomed  Eye Contact:  Good  Speech:  Clear and Coherent  Volume:  Normal  Mood:  NA  Affect:  Appropriate  Thought Process:  Coherent  Orientation:  Full (Time, Place, and Person)  Thought Content: Logical   Suicidal Thoughts:  No  Homicidal Thoughts:  No  Memory:  WNL   Judgement:  Good  Insight:  Good  Psychomotor Activity:  Normal  Concentration:  Concentration: Good  Recall:  Good  Fund of Knowledge: Good  Language: Good  Assets:  Desire for Improvement  ADL's:  Intact  Cognition: WNL  Prognosis:  Good    DIAGNOSES: No diagnosis found.  Receiving Psychotherapy: No Seeking therapist   RECOMMENDATIONS:   Greater than 50% of  30  min face to face time with patient was spent on counseling and coordination of care. We discussed importance of taking medication daily and not skipping doses. He agreed to be more diligent. Says that he is doing well and requesting no changes this visit.  To start Adderall 20 XR daily in the am after breakfast Continue Lexapro to 20 mg.   Will follow up in 4 weeks to reevaluate progress. Will report any side effects or worsening symptoms.  Will continue psychotherapy Provided emergency contact information  Reviewed PDMP     Elwanda Brooklyn, NP

## 2023-01-15 ENCOUNTER — Other Ambulatory Visit (HOSPITAL_BASED_OUTPATIENT_CLINIC_OR_DEPARTMENT_OTHER): Payer: Self-pay

## 2023-01-16 DIAGNOSIS — H9042 Sensorineural hearing loss, unilateral, left ear, with unrestricted hearing on the contralateral side: Secondary | ICD-10-CM | POA: Diagnosis not present

## 2023-01-16 DIAGNOSIS — J330 Polyp of nasal cavity: Secondary | ICD-10-CM | POA: Diagnosis not present

## 2023-01-16 DIAGNOSIS — J988 Other specified respiratory disorders: Secondary | ICD-10-CM | POA: Diagnosis not present

## 2023-01-16 DIAGNOSIS — H933X9 Disorders of unspecified acoustic nerve: Secondary | ICD-10-CM | POA: Diagnosis not present

## 2023-01-20 ENCOUNTER — Ambulatory Visit: Payer: Commercial Managed Care - PPO | Admitting: Family Medicine

## 2023-01-20 ENCOUNTER — Encounter: Payer: Self-pay | Admitting: Family Medicine

## 2023-01-20 VITALS — BP 130/84 | HR 70 | Temp 98.2°F | Ht 67.0 in | Wt 170.8 lb

## 2023-01-20 DIAGNOSIS — F411 Generalized anxiety disorder: Secondary | ICD-10-CM | POA: Diagnosis not present

## 2023-01-20 DIAGNOSIS — J339 Nasal polyp, unspecified: Secondary | ICD-10-CM

## 2023-01-20 DIAGNOSIS — H9192 Unspecified hearing loss, left ear: Secondary | ICD-10-CM

## 2023-01-20 DIAGNOSIS — F3341 Major depressive disorder, recurrent, in partial remission: Secondary | ICD-10-CM

## 2023-01-20 NOTE — Assessment & Plan Note (Addendum)
Stable on Lexapro 20 mg daily. 

## 2023-01-20 NOTE — Assessment & Plan Note (Signed)
Stable on Lexapro 20 mg daily. 

## 2023-01-20 NOTE — Patient Instructions (Signed)
It was very nice to see you today!  We will refer you to ENT.  Please let us know if you need any further assistance.  Please come back in the next 6 to 12 months for your annual physical.  Come back sooner if needed.  Take care, Dr Jerline Pain  PLEASE NOTE:  If you had any lab tests, please let us know if you have not heard back within a few days. You may see your results on mychart before we have a chance to review them but we will give you a call once they are reviewed by [place].   If we ordered any referrals today, please let us know if you have not heard from their office within the next week.   If you had any urgent prescriptions sent in today, please check with the pharmacy within an hour of our visit to make sure the prescription was transmitted appropriately.   Please try these tips to maintain a healthy lifestyle:  Eat at least 3 REAL meals and 1-2 snacks per day.  Aim for no more than 5 hours between eating.  If you eat breakfast, please do so within one hour of getting up.   Each meal should contain half fruits/vegetables, one quarter protein, and one quarter carbs (no bigger than a computer mouse)  Cut down on sweet beverages. This includes juice, soda, and sweet tea.   Drink at least 1 glass of water with each meal and aim for at least 8 glasses per day  Exercise at least 150 minutes every week.

## 2023-01-20 NOTE — Progress Notes (Signed)
   Patrick Burnett is a 27 y.o. male who presents today for an office visit.  Assessment/Plan:  New/Acute Problems: Nasal polyp Likely contributing to recurrent sinusitis.  Will place referral to ENT for further management.  Imaging results are available in epic.  Chronic Problems Addressed Today: Deafness in left ear Follows with ENT.  Not a candidate for cochlear implants.  Generalized anxiety disorder Stable on Lexapro 20 mg daily.  Recurrent major depression in partial remission (HCC) Stable on Lexapro 20 mgdaily.     Subjective:  HPI:  See A/p for status of chronic conditions.  Patient is here to discuss referral to ENT.  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  Was undergoing workup for potential cochlear implant when he obtained an MRI earlier this month.  Was found to have a large nasal polyp 2.7 cm in right nasal passage and nasopharynx extending from right maxillary sinus.  His ENT recommended surgical removal.  He is interested in having this done however would like to be referred to an in network ENT physician to have this done.  Unfortunately his auditory nerve was not clearly delineated on his MRI likely indicating and ever develop.  ENT discussed with him that he would not benefit from cochlear implant.  He does have a longstanding history of allergic rhinitis and recurrent sinus infections predominantly on the right side of his nose.        Objective:  Physical Exam: BP 130/84   Pulse 70   Temp 98.2 F (36.8 C) (Temporal)   Ht 5\' 7"  (1.702 m)   Wt 170 lb 12.8 oz (77.5 kg)   SpO2 96%   BMI 26.75 kg/m   Gen: No acute distress, resting comfortably Neuro: Grossly normal, moves all extremities Psych: Normal affect and thought content      Manreet Kiernan M. Jerline Pain, MD 01/20/2023 1:48 PM

## 2023-01-20 NOTE — Assessment & Plan Note (Signed)
Follows with ENT.  Not a candidate for cochlear implants.

## 2023-02-11 ENCOUNTER — Ambulatory Visit (INDEPENDENT_AMBULATORY_CARE_PROVIDER_SITE_OTHER): Payer: Commercial Managed Care - PPO | Admitting: Behavioral Health

## 2023-02-11 ENCOUNTER — Encounter: Payer: Self-pay | Admitting: Behavioral Health

## 2023-02-11 DIAGNOSIS — F331 Major depressive disorder, recurrent, moderate: Secondary | ICD-10-CM | POA: Diagnosis not present

## 2023-02-11 DIAGNOSIS — F411 Generalized anxiety disorder: Secondary | ICD-10-CM

## 2023-02-11 NOTE — Progress Notes (Signed)
[facility]  Patient ID: Patrick Burnett,  MRN: [id number]  PCP: Ardith Dark, MD  Date of Evaluation: 02/11/2023 Time spent:20 minutes  Chief Complaint:  Chief Complaint   Anxiety; Depression; Follow-up; Medication Refill; Patient Education       HISTORY/CURRENT STATUS: HPI 27 year old male presents to this office for follow up and medication refill. Says he is doing much better. He is trying to be more compliant with medication taking same time everyday.  Adderall is working very well, taking as needed. Request no changes this visit.  He is sleeping 7-8 hours per night. He agrees to 4 week follow up. No mania, No current SI or HI.    No past psychiatric medication: Vyvanse Concerta Methylphenidate    Individual Medical History/ Review of Systems: Changes? :No   Allergies: Amoxicillin, Penicillins, Risperidone, and Risperidone and related  Current Medications:  Current Outpatient Medications:    amphetamine-dextroamphetamine (ADDERALL XR) 20 MG 24 hr capsule, Take 1 capsule (20 mg total) by mouth daily., Disp: 30 capsule, Rfl: 0   escitalopram (LEXAPRO) 20 MG tablet, Take 1 tablet (20 mg total) by mouth daily., Disp: 30 tablet, Rfl: 3   Multiple Vitamins-Minerals (MULTI ADULT GUMMIES) CHEW, See admin instructions., Disp: , Rfl:  Medication Side Effects: none  Family Medical/ Social History: Changes? No  MENTAL HEALTH EXAM:  There were no vitals taken for this visit.There is no height or weight on file to calculate BMI.  General Appearance: Casual, Neat, and Well Groomed  Eye Contact:  Good  Speech:  Clear and Coherent  Volume:  Normal  Mood:  NA  Affect:  Appropriate  Thought Process:  Coherent  Orientation:  Full (Time, Place, and Person)  Thought Content: Logical   Suicidal Thoughts:  No  Homicidal Thoughts:  No  Memory:  WNL  Judgement:  Good  Insight:  Good  Psychomotor Activity:  Normal  Concentration:  Concentration: Good  Recall:   Good  Fund of Knowledge: Good  Language: Good  Assets:  Desire for Improvement  ADL's:  Intact  Cognition: WNL  Prognosis:  Good    DIAGNOSES: No diagnosis found.  Receiving Psychotherapy: No    RECOMMENDATIONS:   Greater than 50% of  30  min face to face time with patient was spent on counseling and coordination of care. We discussed importance of taking medication daily and not skipping doses. He is doing much better with this and anxiety and depression is lifting. Says that he is doing well and requesting no changes this visit.  To start Adderall 20 XR daily in the am after breakfast Continue Lexapro to 20 mg.   Will follow up in 3 months to reevaluate progress. Will report any side effects or worsening symptoms.  Will continue psychotherapy Provided emergency contact information  Reviewed PDMP      Joan Flores, NP

## 2023-02-16 ENCOUNTER — Other Ambulatory Visit (HOSPITAL_BASED_OUTPATIENT_CLINIC_OR_DEPARTMENT_OTHER): Payer: Self-pay

## 2023-02-16 DIAGNOSIS — J342 Deviated nasal septum: Secondary | ICD-10-CM | POA: Diagnosis not present

## 2023-02-16 DIAGNOSIS — J343 Hypertrophy of nasal turbinates: Secondary | ICD-10-CM | POA: Diagnosis not present

## 2023-02-16 DIAGNOSIS — J310 Chronic rhinitis: Secondary | ICD-10-CM | POA: Diagnosis not present

## 2023-02-16 DIAGNOSIS — J338 Other polyp of sinus: Secondary | ICD-10-CM | POA: Diagnosis not present

## 2023-02-16 MED ORDER — FLUTICASONE PROPIONATE 50 MCG/ACT NA SUSP
NASAL | 5 refills | Status: DC
  Filled 2023-02-16: qty 16, 30d supply, fill #0

## 2023-02-16 MED ORDER — PREDNISONE 10 MG PO TABS
ORAL_TABLET | ORAL | 0 refills | Status: DC
  Filled 2023-02-16: qty 21, 6d supply, fill #0

## 2023-02-17 ENCOUNTER — Other Ambulatory Visit (HOSPITAL_BASED_OUTPATIENT_CLINIC_OR_DEPARTMENT_OTHER): Payer: Self-pay

## 2023-02-20 ENCOUNTER — Other Ambulatory Visit: Payer: Self-pay | Admitting: Otolaryngology

## 2023-03-02 ENCOUNTER — Other Ambulatory Visit: Payer: Self-pay

## 2023-03-02 ENCOUNTER — Encounter (HOSPITAL_BASED_OUTPATIENT_CLINIC_OR_DEPARTMENT_OTHER): Payer: Self-pay | Admitting: Otolaryngology

## 2023-03-08 NOTE — Anesthesia Preprocedure Evaluation (Signed)
Anesthesia Evaluation  Patient identified by MRN, date of birth, ID band Patient awake    Reviewed: Allergy & Precautions, H&P , NPO status , Patient's Chart, lab work & pertinent test results  History of Anesthesia Complications Negative for: history of anesthetic complications  Airway Mallampati: I  TM Distance: >3 FB Neck ROM: Full    Dental  (+) Dental Advisory Given   Pulmonary Current Smoker and Patient abstained from smoking.   breath sounds clear to auscultation       Cardiovascular Exercise Tolerance: Good (-) angina negative cardio ROS  Rhythm:Regular Rate:Normal     Neuro/Psych  PSYCHIATRIC DISORDERS Anxiety Depression    negative neurological ROS  negative psych ROS   GI/Hepatic negative GI ROS,,,(+)       marijuana use  Endo/Other  negative endocrine ROS    Renal/GU negative Renal ROS  negative genitourinary   Musculoskeletal   Abdominal   Peds  Hematology negative hematology ROS (+)   Anesthesia Other Findings   Reproductive/Obstetrics negative OB ROS                             Anesthesia Physical Anesthesia Plan  ASA: 2  Anesthesia Plan: General   Post-op Pain Management: Tylenol PO (pre-op)* and Celebrex PO (pre-op)*   Induction: Intravenous  PONV Risk Score and Plan: 2 and Ondansetron, Dexamethasone and Treatment may vary due to age or medical condition  Airway Management Planned: Oral ETT  Additional Equipment: None  Intra-op Plan:   Post-operative Plan: Extubation in OR  Informed Consent: I have reviewed the patients History and Physical, chart, labs and discussed the procedure including the risks, benefits and alternatives for the proposed anesthesia with the patient or authorized representative who has indicated his/her understanding and acceptance.     Dental advisory given  Plan Discussed with: Anesthesiologist, CRNA and Surgeon  Anesthesia  Plan Comments: (Pt vasovagal with IV insertion in pre-op, treated with Ephedrine and Zofran  )        Anesthesia Quick Evaluation

## 2023-03-09 ENCOUNTER — Ambulatory Visit (HOSPITAL_BASED_OUTPATIENT_CLINIC_OR_DEPARTMENT_OTHER)
Admission: RE | Admit: 2023-03-09 | Discharge: 2023-03-09 | Disposition: A | Discharge status: Home / Self Care | Payer: Commercial Managed Care - PPO | Attending: Otolaryngology | Admitting: Otolaryngology

## 2023-03-09 ENCOUNTER — Other Ambulatory Visit (HOSPITAL_BASED_OUTPATIENT_CLINIC_OR_DEPARTMENT_OTHER): Payer: Self-pay

## 2023-03-09 ENCOUNTER — Ambulatory Visit (HOSPITAL_BASED_OUTPATIENT_CLINIC_OR_DEPARTMENT_OTHER): Payer: Commercial Managed Care - PPO | Admitting: Anesthesiology

## 2023-03-09 ENCOUNTER — Encounter (HOSPITAL_BASED_OUTPATIENT_CLINIC_OR_DEPARTMENT_OTHER): Payer: Self-pay | Admitting: Otolaryngology

## 2023-03-09 ENCOUNTER — Encounter (HOSPITAL_BASED_OUTPATIENT_CLINIC_OR_DEPARTMENT_OTHER)
Admission: RE | Disposition: A | Discharge status: Home / Self Care | Payer: Self-pay | Source: Home / Self Care | Attending: Otolaryngology

## 2023-03-09 DIAGNOSIS — J329 Chronic sinusitis, unspecified: Secondary | ICD-10-CM | POA: Diagnosis not present

## 2023-03-09 DIAGNOSIS — J343 Hypertrophy of nasal turbinates: Secondary | ICD-10-CM

## 2023-03-09 DIAGNOSIS — J338 Other polyp of sinus: Secondary | ICD-10-CM

## 2023-03-09 DIAGNOSIS — J3489 Other specified disorders of nose and nasal sinuses: Secondary | ICD-10-CM | POA: Insufficient documentation

## 2023-03-09 DIAGNOSIS — F17200 Nicotine dependence, unspecified, uncomplicated: Secondary | ICD-10-CM | POA: Diagnosis not present

## 2023-03-09 DIAGNOSIS — J342 Deviated nasal septum: Secondary | ICD-10-CM

## 2023-03-09 DIAGNOSIS — F418 Other specified anxiety disorders: Secondary | ICD-10-CM | POA: Diagnosis not present

## 2023-03-09 DIAGNOSIS — J320 Chronic maxillary sinusitis: Secondary | ICD-10-CM | POA: Diagnosis not present

## 2023-03-09 DIAGNOSIS — J310 Chronic rhinitis: Secondary | ICD-10-CM | POA: Insufficient documentation

## 2023-03-09 HISTORY — PX: NASAL SEPTOPLASTY W/ TURBINOPLASTY: SHX2070

## 2023-03-09 HISTORY — PX: MAXILLARY ANTROSTOMY: SHX2003

## 2023-03-09 SURGERY — SEPTOPLASTY, NOSE, WITH NASAL TURBINATE REDUCTION
Anesthesia: General | Site: Nose | Laterality: Right

## 2023-03-09 MED ORDER — AZITHROMYCIN 500 MG PO TABS
500.0000 mg | ORAL_TABLET | Freq: Every day | ORAL | 0 refills | Status: AC
  Filled 2023-03-09: qty 3, 3d supply, fill #0

## 2023-03-09 MED ORDER — LIDOCAINE 2% (20 MG/ML) 5 ML SYRINGE
INTRAMUSCULAR | Status: DC | PRN
  Administered 2023-03-09: 20 mg via INTRAVENOUS

## 2023-03-09 MED ORDER — PROPOFOL 10 MG/ML IV BOLUS
INTRAVENOUS | Status: DC | PRN
  Administered 2023-03-09: 50 mg via INTRAVENOUS
  Administered 2023-03-09: 200 mg via INTRAVENOUS

## 2023-03-09 MED ORDER — OXYCODONE HCL 5 MG/5ML PO SOLN: 5.0000 mg | Freq: Once | ORAL | Status: AC | PRN

## 2023-03-09 MED ORDER — CLINDAMYCIN PHOSPHATE 900 MG/50ML IV SOLN
INTRAVENOUS | Status: DC | PRN
  Administered 2023-03-09: 900 mg via INTRAVENOUS

## 2023-03-09 MED ORDER — ONDANSETRON HCL 4 MG/2ML IJ SOLN
INTRAMUSCULAR | Status: AC
  Filled 2023-03-09: qty 2

## 2023-03-09 MED ORDER — MIDAZOLAM HCL 2 MG/2ML IJ SOLN
INTRAMUSCULAR | Status: AC
  Filled 2023-03-09: qty 2

## 2023-03-09 MED ORDER — ACETAMINOPHEN 325 MG PO TABS: 325.0000 mg | ORAL_TABLET | ORAL | Status: DC | PRN

## 2023-03-09 MED ORDER — ONDANSETRON HCL 4 MG/2ML IJ SOLN: 4.0000 mg | Freq: Once | INTRAMUSCULAR | Status: DC | PRN

## 2023-03-09 MED ORDER — OXYCODONE-ACETAMINOPHEN 5-325 MG PO TABS
1.0000 | ORAL_TABLET | ORAL | 0 refills | Status: AC | PRN
  Filled 2023-03-09: qty 12, 2d supply, fill #0

## 2023-03-09 MED ORDER — DEXMEDETOMIDINE HCL IN NACL 80 MCG/20ML IV SOLN
INTRAVENOUS | Status: DC | PRN
  Administered 2023-03-09: 8 ug via INTRAVENOUS
  Administered 2023-03-09 (×3): 4 ug via INTRAVENOUS
  Administered 2023-03-09: 20 ug via INTRAVENOUS

## 2023-03-09 MED ORDER — MUPIROCIN 2 % EX OINT
TOPICAL_OINTMENT | CUTANEOUS | Status: AC
  Filled 2023-03-09: qty 22

## 2023-03-09 MED ORDER — LIDOCAINE 2% (20 MG/ML) 5 ML SYRINGE
INTRAMUSCULAR | Status: AC
  Filled 2023-03-09: qty 5

## 2023-03-09 MED ORDER — MEPERIDINE HCL 25 MG/ML IJ SOLN: 6.2500 mg | INTRAMUSCULAR | Status: DC | PRN

## 2023-03-09 MED ORDER — MUPIROCIN 2 % EX OINT
TOPICAL_OINTMENT | CUTANEOUS | Status: DC | PRN
  Administered 2023-03-09: 1 via NASAL

## 2023-03-09 MED ORDER — DEXAMETHASONE SODIUM PHOSPHATE 10 MG/ML IJ SOLN
INTRAMUSCULAR | Status: DC | PRN
  Administered 2023-03-09: 10 mg via INTRAVENOUS

## 2023-03-09 MED ORDER — DEXAMETHASONE SODIUM PHOSPHATE 10 MG/ML IJ SOLN
INTRAMUSCULAR | Status: AC
  Filled 2023-03-09: qty 1

## 2023-03-09 MED ORDER — EPHEDRINE SULFATE (PRESSORS) 50 MG/ML IJ SOLN: 10.0000 mg | Freq: Once | INTRAMUSCULAR | Status: DC

## 2023-03-09 MED ORDER — ROCURONIUM BROMIDE 10 MG/ML (PF) SYRINGE
PREFILLED_SYRINGE | INTRAVENOUS | Status: AC
  Filled 2023-03-09: qty 10

## 2023-03-09 MED ORDER — LACTATED RINGERS IV SOLN: INTRAVENOUS | Status: DC

## 2023-03-09 MED ORDER — OXYMETAZOLINE HCL 0.05 % NA SOLN
NASAL | Status: AC
  Filled 2023-03-09: qty 30

## 2023-03-09 MED ORDER — SUGAMMADEX SODIUM 200 MG/2ML IV SOLN
INTRAVENOUS | Status: DC | PRN
  Administered 2023-03-09: 200 mg via INTRAVENOUS

## 2023-03-09 MED ORDER — ACETAMINOPHEN 160 MG/5ML PO SOLN: 325.0000 mg | ORAL | Status: DC | PRN

## 2023-03-09 MED ORDER — DEXMEDETOMIDINE HCL IN NACL 80 MCG/20ML IV SOLN
INTRAVENOUS | Status: AC
  Filled 2023-03-09: qty 20

## 2023-03-09 MED ORDER — MIDAZOLAM HCL 5 MG/5ML IJ SOLN
INTRAMUSCULAR | Status: DC | PRN
  Administered 2023-03-09: 2 mg via INTRAVENOUS

## 2023-03-09 MED ORDER — BUPIVACAINE HCL (PF) 0.25 % IJ SOLN
INTRAMUSCULAR | Status: AC
  Filled 2023-03-09: qty 60

## 2023-03-09 MED ORDER — LIDOCAINE-EPINEPHRINE 1 %-1:100000 IJ SOLN
INTRAMUSCULAR | Status: AC
  Filled 2023-03-09: qty 1

## 2023-03-09 MED ORDER — OXYCODONE HCL 5 MG PO TABS
ORAL_TABLET | ORAL | Status: AC
  Filled 2023-03-09: qty 1

## 2023-03-09 MED ORDER — SUCCINYLCHOLINE CHLORIDE 200 MG/10ML IV SOSY
PREFILLED_SYRINGE | INTRAVENOUS | Status: AC
  Filled 2023-03-09: qty 10

## 2023-03-09 MED ORDER — OXYMETAZOLINE HCL 0.05 % NA SOLN
NASAL | Status: DC | PRN
  Administered 2023-03-09: 1 via TOPICAL

## 2023-03-09 MED ORDER — FENTANYL CITRATE (PF) 100 MCG/2ML IJ SOLN: 25.0000 ug | INTRAMUSCULAR | Status: DC | PRN

## 2023-03-09 MED ORDER — PROPOFOL 10 MG/ML IV BOLUS
INTRAVENOUS | Status: AC
  Filled 2023-03-09: qty 20

## 2023-03-09 MED ORDER — ROCURONIUM BROMIDE 10 MG/ML (PF) SYRINGE
PREFILLED_SYRINGE | INTRAVENOUS | Status: DC | PRN
  Administered 2023-03-09: 60 mg via INTRAVENOUS

## 2023-03-09 MED ORDER — FENTANYL CITRATE (PF) 250 MCG/5ML IJ SOLN
INTRAMUSCULAR | Status: DC | PRN
  Administered 2023-03-09: 100 ug via INTRAVENOUS

## 2023-03-09 MED ORDER — FENTANYL CITRATE (PF) 100 MCG/2ML IJ SOLN
INTRAMUSCULAR | Status: AC
  Filled 2023-03-09: qty 2

## 2023-03-09 MED ORDER — LIDOCAINE-EPINEPHRINE 1 %-1:100000 IJ SOLN
INTRAMUSCULAR | Status: DC | PRN
  Administered 2023-03-09: 5 mL

## 2023-03-09 MED ORDER — OXYCODONE HCL 5 MG PO TABS
5.0000 mg | ORAL_TABLET | Freq: Once | ORAL | Status: AC | PRN
  Administered 2023-03-09: 5 mg via ORAL

## 2023-03-09 MED ORDER — ONDANSETRON HCL 4 MG/2ML IJ SOLN
4.0000 mg | Freq: Once | INTRAMUSCULAR | Status: AC
  Administered 2023-03-09: 4 mg via INTRAVENOUS

## 2023-03-09 MED ORDER — EPHEDRINE 5 MG/ML INJ
10.0000 mg | Freq: Once | INTRAVENOUS | Status: AC
  Administered 2023-03-09: 10 mg via INTRAVENOUS

## 2023-03-09 MED ORDER — BACITRACIN ZINC 500 UNIT/GM EX OINT
TOPICAL_OINTMENT | CUTANEOUS | Status: AC
  Filled 2023-03-09: qty 28.35

## 2023-03-09 SURGICAL SUPPLY — 42 items
ATTRACTOMAT 16X20 MAGNETIC DRP (DRAPES) IMPLANT
BLADE RAD40 ROTATE 4M 4 5PK (BLADE) IMPLANT
BLADE RAD60 ROTATE M4 4 5PK (BLADE) IMPLANT
BLADE TRICUT ROTATE M4 4 5PK (BLADE) IMPLANT
CANISTER SUC SOCK COL 7IN (MISCELLANEOUS) ×2 IMPLANT
CANISTER SUCT 1200ML W/VALVE (MISCELLANEOUS) ×2 IMPLANT
COAGULATOR SUCT 8FR VV (MISCELLANEOUS) ×2 IMPLANT
DEFOGGER MIRROR 1QT (MISCELLANEOUS) ×2 IMPLANT
DRSG NASOPORE 8CM (GAUZE/BANDAGES/DRESSINGS) IMPLANT
DRSG TELFA 3X8 NADH STRL (GAUZE/BANDAGES/DRESSINGS) IMPLANT
ELECT REM PT RETURN 9FT ADLT (ELECTROSURGICAL) ×2
ELECTRODE REM PT RTRN 9FT ADLT (ELECTROSURGICAL) ×2 IMPLANT
GAUZE SPONGE 2X2 STRL 8-PLY (GAUZE/BANDAGES/DRESSINGS) ×2 IMPLANT
GLOVE BIO SURGEON STRL SZ7.5 (GLOVE) ×2 IMPLANT
GOWN STRL REUS W/ TWL LRG LVL3 (GOWN DISPOSABLE) ×4 IMPLANT
GOWN STRL REUS W/ TWL XL LVL3 (GOWN DISPOSABLE) IMPLANT
GOWN STRL REUS W/TWL LRG LVL3 (GOWN DISPOSABLE) ×2
GOWN STRL REUS W/TWL XL LVL3 (GOWN DISPOSABLE) ×2
HEMOSTAT SURGICEL 2X14 (HEMOSTASIS) IMPLANT
IV NS 500ML (IV SOLUTION) ×2
IV NS 500ML BAXH (IV SOLUTION) ×2 IMPLANT
NDL HYPO 25X1 1.5 SAFETY (NEEDLE) ×2 IMPLANT
NEEDLE HYPO 25X1 1.5 SAFETY (NEEDLE) ×2 IMPLANT
NS IRRIG 1000ML POUR BTL (IV SOLUTION) ×2 IMPLANT
PACK BASIN DAY SURGERY FS (CUSTOM PROCEDURE TRAY) ×2 IMPLANT
PACK ENT DAY SURGERY (CUSTOM PROCEDURE TRAY) ×2 IMPLANT
SLEEVE SCD COMPRESS KNEE MED (STOCKING) IMPLANT
SPIKE FLUID TRANSFER (MISCELLANEOUS) IMPLANT
SPLINT NASAL AIRWAY SILICONE (MISCELLANEOUS) ×2 IMPLANT
SPONGE NEURO XRAY DETECT 1X3 (DISPOSABLE) ×2 IMPLANT
SUCTION FRAZIER HANDLE 10FR (MISCELLANEOUS)
SUCTION TUBE FRAZIER 10FR DISP (MISCELLANEOUS) IMPLANT
SUT CHROMIC 4 0 P 3 18 (SUTURE) ×2 IMPLANT
SUT PLAIN 4 0 ~~LOC~~ 1 (SUTURE) ×2 IMPLANT
SUT PROLENE 3 0 PS 2 (SUTURE) ×2 IMPLANT
SUT VIC AB 4-0 P-3 18XBRD (SUTURE) IMPLANT
SUT VIC AB 4-0 P3 18 (SUTURE)
SYR 50ML LL SCALE MARK (SYRINGE) IMPLANT
TOWEL GREEN STERILE FF (TOWEL DISPOSABLE) ×2 IMPLANT
TUBE CONNECTING 20X1/4 (TUBING) ×2 IMPLANT
TUBE SALEM SUMP 16F (TUBING) ×2 IMPLANT
YANKAUER SUCT BULB TIP NO VENT (SUCTIONS) ×2 IMPLANT

## 2023-03-09 NOTE — Discharge Instructions (Addendum)
POSTOPERATIVE INSTRUCTIONS FOR PATIENTS HAVING NASAL OR SINUS OPERATIONS ACTIVITY: Restrict activity at home for the first two days, resting as much as possible. Light activity is best. You may usually return to work within a week. You should refrain from nose blowing, strenuous activity, or heavy lifting greater than 20lbs for a total of one week after your operation.  If sneezing cannot be avoided, sneeze with your mouth open. DISCOMFORT: You may experience a dull headache and pressure along with nasal congestion and discharge. These symptoms may be worse during the first week after the operation but may last as long as two to four weeks.  Please take Tylenol or the pain medication that has been prescribed for you. Do not take aspirin or aspirin containing medications since they may cause bleeding.  You may experience symptoms of post nasal drainage, nasal congestion, headaches and fatigue for two or three months after your operation.  BLEEDING: You may have some blood tinged nasal drainage for approximately two weeks after the operation.  The discharge will be worse for the first week.  Please call our office at [phone] or go to the nearest hospital emergency room if you experience any of the following: heavy, bright red blood from your nose or mouth that lasts longer than 15 minutes or coughing up or vomiting bright red blood or blood clots. GENERAL CONSIDERATIONS: A gauze dressing will be placed on your upper lip to absorb any drainage after the operation. You may need to change this several times a day.  If you do not have very much drainage, you may remove the dressing.  Remember that you may gently wipe your nose with a tissue and sniff in, but DO NOT blow your nose. Please keep all of your postoperative appointments.  Your final results after the operation will depend on proper follow-up.  The initial visit is usually 2 to 5 days after the operation.  During this visit, the remaining nasal  packing and internal septal splints will be removed.  Your nasal and sinus cavities will be cleaned.  During the second visit, your nasal and sinus cavities will be cleaned again. Have someone drive you to your first two postoperative appointments.  How you care for your nose after the operation will influence the results that you obtain.  You should follow all directions, take your medication as prescribed, and call our office [phone] with any problems or questions. You may be more comfortable sleeping with your head elevated on two pillows. Do not take any medications that we have not prescribed or recommended. WARNING SIGNS: if any of the following should occur, please call our office: Persistent fever greater than 102F. Persistent vomiting. Severe and constant pain that is not relieved by prescribed pain medication. Trauma to the nose. Rash or unusual side effects from any medicines.    Post Anesthesia Home Care Instructions  Activity: Get plenty of rest for the remainder of the day. A responsible individual must stay with you for 24 hours following the procedure.  For the next 24 hours, DO NOT: -Drive a car -Paediatric nurse -Drink alcoholic beverages -Take any medication unless instructed by your physician -Make any legal decisions or sign important papers.  Meals: Start with liquid foods such as gelatin or soup. Progress to regular foods as tolerated. Avoid greasy, spicy, heavy foods. If nausea and/or vomiting occur, drink only clear liquids until the nausea and/or vomiting subsides. Call your physician if vomiting continues.  Special Instructions/Symptoms: Your throat may feel dry  or sore from the anesthesia or the breathing tube placed in your throat during surgery. If this causes discomfort, gargle with warm salt water. The discomfort should disappear within 24 hours.

## 2023-03-09 NOTE — H&P (Signed)
Cc: Chronic nasal obstruction, right nasal polyp  HPI: The patient is a 27 year old male who presents today for evaluation of his chronic nasal obstruction and right antrochoanal polyp.  The patient has a history of severe environmental allergies.  He was on immunotherapy and steroid nasal sprays for several years.  He is currently on Claritin daily.  He was recently treated with antibiotic and prednisone to treat his sinus infection.  Over the past 10+ years, he has been having significant nasal obstruction, worse on the right side.  He is a habitual mouth breather.  He recently underwent an MRI scan to evaluate his asymmetric hearing loss.  The MRI scan showed a right antrochoanal polyp, obstructing the right posterior nasal cavity.  Currently he denies any facial pain, fever, or visual change.  He has no previous history of ENT surgery.  The patient's review of systems (constitutional, eyes, ENT, cardiovascular, respiratory, GI, musculoskeletal, skin, neurologic, psychiatric, endocrine, hematologic, allergic) is noted in the ROS questionnaire.  It is reviewed with the patient.  Major events: None.  Ongoing medical problems: None.  Family health history: Diabetes, hearing loss.  Social history: The patient is single. He denies the use of tobacco, alcohol or illegal drugs.    Exam: General: Communicates without difficulty, well nourished, no acute distress. Head: Normocephalic, no evidence injury, no tenderness, facial buttresses intact without stepoff. Face/sinus: No tenderness to palpation and percussion. Facial movement is normal and symmetric. Eyes: PERRL, EOMI. No scleral icterus, conjunctivae clear. Neuro: CN II exam reveals vision grossly intact.  No nystagmus at any point of gaze. Ears: Auricles well formed without lesions.  Ear canals are intact without mass or lesion.  No erythema or edema is appreciated.  The TMs are intact without fluid. Nose: External evaluation reveals normal support and  skin without lesions.  Dorsum is intact.  Anterior rhinoscopy reveals congested mucosa over anterior aspect of inferior turbinates and intact septum.  No purulence noted. Oral:  Oral cavity and oropharynx are intact, symmetric, without erythema or edema.  Mucosa is moist without lesions. Neck: Full range of motion without pain.  There is no significant lymphadenopathy.  No masses palpable.  Thyroid bed within normal limits to palpation.  Parotid glands and submandibular glands equal bilaterally without mass.  Trachea is midline. Neuro:  CN 2-12 grossly intact. Gait normal. A flexible scope was inserted into the right nasal cavity.  Endoscopy of the interior nasal cavity, superior, inferior, and middle meatus was performed. The sphenoid-ethmoid recess was examined. Edematous mucosa was noted.  Antrochoanal polyp noted from the right maxillary sinus, obstructing the right posterior nasal cavity.  Severe nasal septal deviation noted. Olfactory cleft was clear.  Nasopharynx was clear.  Turbinates were hypertrophied but without mass.  The procedure was repeated on the contralateral side with similar findings.  The patient tolerated the procedure well.   Assessment  1.  Chronic rhinitis with nasal mucosal congestion, severe nasal septal deviation, and bilateral inferior turbinate hypertrophy.  More than 95% of his nasal passageways are obstructed bilaterally, worse on the right side. 2.  Right antrochoanal polyp, obstructing the right posterior nasal cavity.  Plan  1.  The physical exam and nasal endoscopy findings are reviewed with the patient. 2.  Continue Flonase nasal spray 2 sprays each nostril daily. 3.  Prednisone Dosepak for 6 days. 4.  In light of his persistent symptoms, despite medical treatment for the past 10+ years, he will likely benefit from surgical intervention with septoplasty, turbinate reduction, and  right maxillary antrostomy with polyp removal.  The risk, benefits, and details of the  procedures are reviewed.  Questions are invited and answered. 5.  The patient would like to proceed with the procedures.

## 2023-03-09 NOTE — Transfer of Care (Signed)
Immediate Anesthesia Transfer of Care Note  Patient: Patrick Burnett  Procedure(s) Performed: NASAL SEPTOPLASTY WITH BILATERAL TURBINATE REDUCTION (Bilateral: Nose) RIGHT MAXILLARY ANTROSTOMY WITH TISSUE REMOVAL (Right: Nose)  Patient Location: PACU  Anesthesia Type:General  Level of Consciousness: drowsy  Airway & Oxygen Therapy: Patient Spontanous Breathing and Patient connected to face mask oxygen  Post-op Assessment: Report given to RN and Post -op Vital signs reviewed and stable  Post vital signs: Reviewed and stable  Last Vitals:  Vitals Value Taken Time  BP 117/80 03/09/23 0900  Temp 36.7 C 03/09/23 0859  Pulse 103 03/09/23 0901  Resp 21 03/09/23 0901  SpO2 99 % 03/09/23 0901  Vitals shown include unvalidated device data.  Last Pain:  Vitals:   03/09/23 0650  TempSrc: Oral  PainSc: 0-No pain      Patients Stated Pain Goal: 3 (03/09/23 0650)  Complications: No notable events documented.

## 2023-03-09 NOTE — Anesthesia Procedure Notes (Signed)
Procedure Name: Intubation Date/Time: 03/09/2023 7:40 AM  Performed by: Demetrio Lapping, CRNAPre-anesthesia Checklist: Patient identified, Emergency Drugs available, Suction available and Patient being monitored Patient Re-evaluated:Patient Re-evaluated prior to induction Oxygen Delivery Method: Circle System Utilized Preoxygenation: Pre-oxygenation with 100% oxygen Induction Type: IV induction Ventilation: Mask ventilation without difficulty Laryngoscope Size: Mac and 4 Grade View: Grade I Tube type: Oral Tube size: 7.0 mm Number of attempts: 1 Airway Equipment and Method: Stylet Placement Confirmation: ETT inserted through vocal cords under direct vision, positive ETCO2 and breath sounds checked- equal and bilateral Secured at: 23 cm Tube secured with: Tape Dental Injury: Teeth and Oropharynx as per pre-operative assessment

## 2023-03-09 NOTE — Anesthesia Postprocedure Evaluation (Signed)
Anesthesia Post Note  Patient: [name]  Procedure(s) Performed: NASAL SEPTOPLASTY WITH BILATERAL TURBINATE REDUCTION (Bilateral: Nose) RIGHT MAXILLARY ANTROSTOMY WITH TISSUE REMOVAL (Right: Nose)     Patient location during evaluation: PACU Anesthesia Type: General Level of consciousness: awake and alert Pain management: pain level controlled Vital Signs Assessment: post-procedure vital signs reviewed and stable Respiratory status: spontaneous breathing, nonlabored ventilation, respiratory function stable and patient connected to nasal cannula oxygen Cardiovascular status: blood pressure returned to baseline and stable Postop Assessment: no apparent nausea or vomiting Anesthetic complications: no   No notable events documented.  Last Vitals:  Vitals:   03/09/23 0915 03/09/23 0939  BP: (!) 129/94 (!) 130/91  Pulse: 97 99  Resp: 18 16  Temp:  (!) 36.4 C  SpO2: 99% 98%    Last Pain:  Vitals:   03/09/23 0945  TempSrc:   PainSc: 3                  Patrick Burnett

## 2023-03-09 NOTE — Op Note (Signed)
DATE OF PROCEDURE: 03/09/2023  OPERATIVE REPORT   SURGEON: Newman Pies, MD   PREOPERATIVE DIAGNOSES:  1. Severe nasal septal deviation.  2. Bilateral inferior turbinate hypertrophy.  3. Chronic nasal obstruction. 4. Right antrochoanal polyp.  POSTOPERATIVE DIAGNOSES:  1. Severe nasal septal deviation.  2. Bilateral inferior turbinate hypertrophy.  3. Chronic nasal obstruction. 4. Right antrochoanal polyp.  PROCEDURE PERFORMED:  1. Septoplasty.  2. Bilateral partial inferior turbinate resection.  3. Right endoscopic maxillary antrostomy with polyp removal.  ANESTHESIA: General endotracheal tube anesthesia.   COMPLICATIONS: None.   ESTIMATED BLOOD LOSS: 50 mL.   INDICATION FOR PROCEDURE: Patrick Burnett is a 27 y.o. male with a history of chronic nasal obstruction and right antrochoanal polyp.  Over the past 10+ years, he has been having significant nasal obstruction, worse on the right side.  He was a habitual mouth breather. His speech recent MRI scan showed a right antrochoanal polyp, obstructing the right posterior nasal cavity. The patient was treated with antihistamine, decongestant, and steroid nasal sprays. However, the patient continued to be symptomatic. On examination, the patient was noted to have bilateral severe inferior turbinate hypertrophy and significant nasal septal deviation, causing significant nasal obstruction. Based on the above findings, the decision was made for the patient to undergo the above-stated procedures. The risks, benefits, alternatives, and details of the procedures were discussed with the patient. Questions were invited and answered. Informed consent was obtained.   DESCRIPTION OF PROCEDURE: The patient was taken to the operating room and placed supine on the operating table. General endotracheal tube anesthesia was administered by the anesthesiologist. The patient was positioned, and prepped and draped in the standard fashion for nasal surgery. Pledgets  soaked with Afrin were placed in both nasal cavities for decongestion. The pledgets were subsequently removed.   Examination of the nasal cavity revealed a severe nasal septal deviation. 1% lidocaine with 1:100,000 epinephrine was injected onto the nasal septum bilaterally. A hemitransfixion incision was made on the left side. The mucosal flap was carefully elevated on the left side. A cartilaginous incision was made 1 cm superior to the caudal margin of the nasal septum. Mucosal flap was also elevated on the right side in the similar fashion. It should be noted that due to the severe septal deviation, the deviated portion of the cartilaginous and bony septum had to be removed in piecemeal fashion. Once the deviated portions were removed, a straight midline septum was achieved. The septum was then quilted with 4-0 plain gut sutures. The hemitransfixion incision was closed with interrupted 4-0 chromic sutures.   The inferior one half of both hypertrophied inferior turbinate was crossclamped with a Kelly clamp. The inferior one half of each inferior turbinate was then resected with a pair of cross cutting scissors. Hemostasis was achieved with a suction cautery device.   Using a 0 endoscope, the right nasal cavity was examined.  The right middle turbinate was medialized using a Therapist, nutritional.  A large antrochoanal polyp was noted. The uncinate process was resected with a freer elevator.  The antrochoanal polyp was removed in a piecemeal fashion using a combination of microdebrider and Blakesley forceps. The maxillary antrum was entered and enlarged using a combination of backbiter and microdebrider. Polypoid tissue was removed from the right maxillary sinus.  The right maxillary sinus was copiously irrigated with saline solution.  Doyle splints were applied to the nasal septum.  The care of the patient was turned over to the anesthesiologist. The patient was awakened from  anesthesia without difficulty. The  patient was extubated and transferred to the recovery room in good condition.   OPERATIVE FINDINGS: Severe nasal septal deviation and bilateral inferior turbinate hypertrophy.  Right antrochoanal polyp.  SPECIMEN: Right maxillary antrochoanal polyp.  FOLLOWUP CARE: The patient be discharged home once he is awake and alert.  The patient will follow up in my office in 3 Days for splint removal.   Rande Roylance Philomena Doheny, MD

## 2023-03-10 ENCOUNTER — Encounter (HOSPITAL_BASED_OUTPATIENT_CLINIC_OR_DEPARTMENT_OTHER): Payer: Self-pay | Admitting: Otolaryngology

## 2023-03-10 LAB — SURGICAL PATHOLOGY

## 2023-03-11 ENCOUNTER — Other Ambulatory Visit: Payer: Self-pay | Admitting: Behavioral Health

## 2023-03-11 ENCOUNTER — Other Ambulatory Visit: Payer: Self-pay

## 2023-03-11 ENCOUNTER — Other Ambulatory Visit (HOSPITAL_BASED_OUTPATIENT_CLINIC_OR_DEPARTMENT_OTHER): Payer: Self-pay

## 2023-03-11 DIAGNOSIS — F902 Attention-deficit hyperactivity disorder, combined type: Secondary | ICD-10-CM

## 2023-03-11 MED ORDER — AMPHETAMINE-DEXTROAMPHET ER 20 MG PO CP24
20.0000 mg | ORAL_CAPSULE | Freq: Every day | ORAL | 0 refills | Status: DC
Start: 2023-03-11 — End: 2023-04-02
  Filled 2023-03-11: qty 30, 30d supply, fill #0

## 2023-03-12 DIAGNOSIS — J320 Chronic maxillary sinusitis: Secondary | ICD-10-CM | POA: Diagnosis not present

## 2023-03-12 DIAGNOSIS — J338 Other polyp of sinus: Secondary | ICD-10-CM | POA: Diagnosis not present

## 2023-03-23 ENCOUNTER — Ambulatory Visit (INDEPENDENT_AMBULATORY_CARE_PROVIDER_SITE_OTHER): Payer: Commercial Managed Care - PPO | Admitting: Behavioral Health

## 2023-03-25 ENCOUNTER — Ambulatory Visit (INDEPENDENT_AMBULATORY_CARE_PROVIDER_SITE_OTHER): Payer: Commercial Managed Care - PPO | Admitting: Behavioral Health

## 2023-03-25 ENCOUNTER — Encounter: Payer: Self-pay | Admitting: Behavioral Health

## 2023-03-25 DIAGNOSIS — F411 Generalized anxiety disorder: Secondary | ICD-10-CM | POA: Diagnosis not present

## 2023-03-25 DIAGNOSIS — F3341 Major depressive disorder, recurrent, in partial remission: Secondary | ICD-10-CM | POA: Diagnosis not present

## 2023-03-25 NOTE — Progress Notes (Signed)
[facility]  Patient ID: Patrick Burnett,  MRN: [id number]  PCP: Ardith Dark, MD  Date of Evaluation: 03/25/2023 Time spent:20 minutes  Chief Complaint:  Chief Complaint   Anxiety; Depression; Medication Refill; Patient Education; Follow-up     HISTORY/CURRENT STATUS: HPI 27 year old male presents to this office for follow up and medication refill. Says he is doing much better. He is trying to be more compliant with medication taking same time everyday.  Adderall is working very well, taking as needed.  He is switching insurance June 1 and does not want to make any changes today. Request no changes this visit.  He is sleeping 7-8 hours per night. He agrees to 4 week follow up. No mania, No current SI or HI.    No past psychiatric medication: Vyvanse Concerta Methylphenidate     Individual Medical History/ Review of Systems: Changes? :No   Allergies: Amoxicillin, Penicillins, Risperidone, and Risperidone and related  Current Medications:  Current Outpatient Medications:    amphetamine-dextroamphetamine (ADDERALL XR) 20 MG 24 hr capsule, Take 1 capsule (20 mg total) by mouth daily., Disp: 30 capsule, Rfl: 0   Cholecalciferol (VITAMIN D) 50 MCG (2000 UT) CAPS, Take by mouth., Disp: , Rfl:    escitalopram (LEXAPRO) 20 MG tablet, Take 1 tablet (20 mg total) by mouth daily., Disp: 30 tablet, Rfl: 3   predniSONE (DELTASONE) 10 MG tablet, Taper as directed, Disp: 21 tablet, Rfl: 0 Medication Side Effects: none  Family Medical/ Social History: Changes? No  MENTAL HEALTH EXAM:  There were no vitals taken for this visit.There is no height or weight on file to calculate BMI.  General Appearance: Casual, Neat, and Well Groomed  Eye Contact:  Good  Speech:  Clear and Coherent  Volume:  Normal  Mood:  NA  Affect:  Appropriate  Thought Process:  Coherent  Orientation:  Full (Time, Place, and Person)  Thought Content: Logical   Suicidal Thoughts:  No  Homicidal  Thoughts:  No  Memory:  WNL  Judgement:  Good  Insight:  Good  Psychomotor Activity:  Normal  Concentration:  Concentration: Good  Recall:  Good  Fund of Knowledge: Good  Language: Good  Assets:  Desire for Improvement  ADL's:  Intact  Cognition: WNL  Prognosis:  Good    DIAGNOSES: No diagnosis found.  Receiving Psychotherapy: No    RECOMMENDATIONS:   Greater than 50% of  30  min face to face time with patient was spent on counseling and coordination of care. We discussed importance of taking medication daily and not skipping doses. He is doing much better with this and anxiety and depression has improved, but experiencing some frequent irritability. Requesting no changes until new insurance kicks in. Continue Adderall 20 XR daily in the am after breakfast Continue Lexapro to 20 mg.   Will follow up in 4 weeks to reevaluate progress. Will report any side effects or worsening symptoms.  Will continue psychotherapy Provided emergency contact information  Reviewed PDMP      Joan Flores, NP

## 2023-04-02 ENCOUNTER — Other Ambulatory Visit: Payer: Self-pay | Admitting: Behavioral Health

## 2023-04-02 ENCOUNTER — Other Ambulatory Visit (HOSPITAL_BASED_OUTPATIENT_CLINIC_OR_DEPARTMENT_OTHER): Payer: Self-pay

## 2023-04-02 DIAGNOSIS — J338 Other polyp of sinus: Secondary | ICD-10-CM | POA: Diagnosis not present

## 2023-04-02 DIAGNOSIS — F902 Attention-deficit hyperactivity disorder, combined type: Secondary | ICD-10-CM

## 2023-04-02 DIAGNOSIS — J320 Chronic maxillary sinusitis: Secondary | ICD-10-CM | POA: Diagnosis not present

## 2023-04-02 NOTE — Telephone Encounter (Signed)
Due 6/7 

## 2023-04-08 ENCOUNTER — Other Ambulatory Visit (HOSPITAL_BASED_OUTPATIENT_CLINIC_OR_DEPARTMENT_OTHER): Payer: Self-pay

## 2023-04-10 ENCOUNTER — Other Ambulatory Visit: Payer: Self-pay

## 2023-04-10 ENCOUNTER — Other Ambulatory Visit (HOSPITAL_BASED_OUTPATIENT_CLINIC_OR_DEPARTMENT_OTHER): Payer: Self-pay

## 2023-04-10 MED ORDER — AMPHETAMINE-DEXTROAMPHET ER 20 MG PO CP24
20.0000 mg | ORAL_CAPSULE | Freq: Every day | ORAL | 0 refills | Status: DC
Start: 2023-04-10 — End: 2023-07-14
  Filled 2023-04-10: qty 30, 30d supply, fill #0

## 2023-04-22 ENCOUNTER — Ambulatory Visit: Payer: 59 | Admitting: Behavioral Health

## 2023-04-22 ENCOUNTER — Encounter: Payer: Self-pay | Admitting: Behavioral Health

## 2023-04-22 DIAGNOSIS — F411 Generalized anxiety disorder: Secondary | ICD-10-CM

## 2023-04-22 DIAGNOSIS — F3341 Major depressive disorder, recurrent, in partial remission: Secondary | ICD-10-CM | POA: Diagnosis not present

## 2023-04-22 NOTE — Progress Notes (Signed)
[facility]  Patient ID: Patrick Burnett,  MRN: [id number]  PCP: Ardith Dark, MD  Date of Evaluation: 04/22/2023 Time spent:30 minutes  Chief Complaint:  Chief Complaint   Anxiety; Depression; Follow-up; Medication Refill; Patient Education     HISTORY/CURRENT STATUS: HPI  27 year old male presents to this office for follow up and medication refill.  No changes this visit. Says he is doing much better. He is trying to be more compliant with medication taking same time everyday.  Adderall is working very well, taking as needed.   Request no changes this visit.  He is sleeping 7-8 hours per night. He agrees to 4 week follow up. No mania, No current SI or HI.    No past psychiatric medication: Vyvanse Concerta Methylphenidate   Individual Medical History/ Review of Systems: Changes? :No   Allergies: Amoxicillin, Penicillins, Risperidone, and Risperidone and related  Current Medications:  Current Outpatient Medications:    amphetamine-dextroamphetamine (ADDERALL XR) 20 MG 24 hr capsule, Take 1 capsule (20 mg total) by mouth daily., Disp: 30 capsule, Rfl: 0   Cholecalciferol (VITAMIN D) 50 MCG (2000 UT) CAPS, Take by mouth., Disp: , Rfl:    escitalopram (LEXAPRO) 20 MG tablet, Take 1 tablet (20 mg total) by mouth daily., Disp: 30 tablet, Rfl: 3   predniSONE (DELTASONE) 10 MG tablet, Taper as directed, Disp: 21 tablet, Rfl: 0 Medication Side Effects: none  Family Medical/ Social History: Changes? No  MENTAL HEALTH EXAM:  There were no vitals taken for this visit.There is no height or weight on file to calculate BMI.  General Appearance: Casual and Neat  Eye Contact:  Good  Speech:  Clear and Coherent  Volume:  Normal  Mood:  NA  Affect:  Congruent  Thought Process:  Coherent  Orientation:  Full (Time, Place, and Person)  Thought Content: Logical   Suicidal Thoughts:  No  Homicidal Thoughts:  No  Memory:  WNL  Judgement:  Good  Insight:  Good   Psychomotor Activity:  Normal  Concentration:  Concentration: Good  Recall:  Good  Fund of Knowledge: Good  Language: Good  Assets:  Desire for Improvement  ADL's:  Intact  Cognition: WNL  Prognosis:  Good    DIAGNOSES: No diagnosis found.  Receiving Psychotherapy: No    RECOMMENDATIONS:   Greater than 50% of  30  min face to face time with patient was spent on counseling and coordination of care. We discussed importance of taking medication daily and not skipping doses. He is doing much better with this and anxiety and depression has improved. Will be having insurance lapse in Sept. Will purchase on [employer].   Requesting no changes until new insurance kicks in. Continue Adderall 20 XR daily in the am after breakfast Continue Lexapro to 20 mg.   Will follow up in 2 months to reevaluate progress. Will report any side effects or worsening symptoms.  Will continue psychotherapy Provided emergency contact information  Reviewed PDMP     Joan Flores, NP

## 2023-05-01 ENCOUNTER — Other Ambulatory Visit: Payer: Self-pay

## 2023-05-01 DIAGNOSIS — F3341 Major depressive disorder, recurrent, in partial remission: Secondary | ICD-10-CM

## 2023-05-01 DIAGNOSIS — F331 Major depressive disorder, recurrent, moderate: Secondary | ICD-10-CM

## 2023-05-01 DIAGNOSIS — F411 Generalized anxiety disorder: Secondary | ICD-10-CM

## 2023-05-01 MED ORDER — ESCITALOPRAM OXALATE 20 MG PO TABS
20.0000 mg | ORAL_TABLET | Freq: Every day | ORAL | 0 refills | Status: DC
Start: 2023-05-01 — End: 2023-07-14

## 2023-05-13 ENCOUNTER — Telehealth: Payer: Commercial Managed Care - PPO | Admitting: Behavioral Health

## 2023-06-23 ENCOUNTER — Ambulatory Visit: Payer: Commercial Managed Care - PPO | Admitting: Behavioral Health

## 2023-07-14 ENCOUNTER — Other Ambulatory Visit (HOSPITAL_BASED_OUTPATIENT_CLINIC_OR_DEPARTMENT_OTHER): Payer: Self-pay

## 2023-07-14 ENCOUNTER — Encounter: Payer: Self-pay | Admitting: Behavioral Health

## 2023-07-14 ENCOUNTER — Ambulatory Visit (INDEPENDENT_AMBULATORY_CARE_PROVIDER_SITE_OTHER): Payer: 59 | Admitting: Behavioral Health

## 2023-07-14 ENCOUNTER — Other Ambulatory Visit: Payer: Self-pay

## 2023-07-14 ENCOUNTER — Other Ambulatory Visit: Payer: Self-pay | Admitting: Behavioral Health

## 2023-07-14 DIAGNOSIS — F3341 Major depressive disorder, recurrent, in partial remission: Secondary | ICD-10-CM

## 2023-07-14 DIAGNOSIS — F411 Generalized anxiety disorder: Secondary | ICD-10-CM

## 2023-07-14 DIAGNOSIS — F840 Autistic disorder: Secondary | ICD-10-CM

## 2023-07-14 DIAGNOSIS — F331 Major depressive disorder, recurrent, moderate: Secondary | ICD-10-CM

## 2023-07-14 DIAGNOSIS — F902 Attention-deficit hyperactivity disorder, combined type: Secondary | ICD-10-CM

## 2023-07-14 DIAGNOSIS — F39 Unspecified mood [affective] disorder: Secondary | ICD-10-CM

## 2023-07-14 MED ORDER — AMPHETAMINE-DEXTROAMPHET ER 20 MG PO CP24
20.0000 mg | ORAL_CAPSULE | Freq: Every day | ORAL | 0 refills | Status: DC
Start: 2023-07-14 — End: 2023-11-19
  Filled 2023-07-14: qty 30, 30d supply, fill #0

## 2023-07-14 MED ORDER — BUPROPION HCL ER (XL) 150 MG PO TB24
150.0000 mg | ORAL_TABLET | Freq: Every day | ORAL | 1 refills | Status: DC
Start: 2023-07-14 — End: 2023-09-10
  Filled 2023-07-14: qty 30, 30d supply, fill #0
  Filled 2023-08-14 – 2023-08-15 (×2): qty 30, 30d supply, fill #1

## 2023-07-14 NOTE — Progress Notes (Signed)
[facility]  Patient ID: Patrick Burnett,  MRN: [id number]  PCP: Ardith Dark, MD  Date of Evaluation: 07/14/2023 Time spent:30 minutes  Chief Complaint:   HISTORY/CURRENT STATUS: HPI 27 year old male presents to this office for follow up and medication refill.  Says that he will be losing his insurance in a few days. He will be going on medicare and will not be able to continue in this practice. Says he is feeling some seasonal depression right now. Is requesting 90 day supply on medications. He says he will be following up with a practice that accepts medicare.   Adderall is working very well, taking as needed.  He is sleeping 7-8 hours per night. No mania, No current SI or HI.    No past psychiatric medication: Vyvanse Concerta Methylphenidate  Individual Medical History/ Review of Systems: Changes? :No   Allergies: Amoxicillin, Penicillins, Risperidone, and Risperidone and related  Current Medications:  Current Outpatient Medications:    buPROPion (WELLBUTRIN XL) 150 MG 24 hr tablet, Take 1 tablet (150 mg total) by mouth daily., Disp: 30 tablet, Rfl: 1   amphetamine-dextroamphetamine (ADDERALL XR) 20 MG 24 hr capsule, Take 1 capsule (20 mg total) by mouth daily., Disp: 90 capsule, Rfl: 0   Cholecalciferol (VITAMIN D) 50 MCG (2000 UT) CAPS, Take by mouth., Disp: , Rfl:    escitalopram (LEXAPRO) 20 MG tablet, Take 1 tablet (20 mg total) by mouth daily., Disp: 90 tablet, Rfl: 0   predniSONE (DELTASONE) 10 MG tablet, Taper as directed, Disp: 21 tablet, Rfl: 0 Medication Side Effects: none  Family Medical/ Social History: Changes? No  MENTAL HEALTH EXAM:  There were no vitals taken for this visit.There is no height or weight on file to calculate BMI.  General Appearance: Casual, Neat, and Well Groomed  Eye Contact:  Good  Speech:  Clear and Coherent  Volume:  Normal  Mood:  Anxious, Depressed, and Dysphoric  Affect:  Appropriate  Thought Process:  Coherent   Orientation:  Full (Time, Place, and Person)  Thought Content: Logical   Suicidal Thoughts:  No  Homicidal Thoughts:  No  Memory:  WNL  Judgement:  Good  Insight:  Good  Psychomotor Activity:  Normal  Concentration:  Concentration: Good  Recall:  Good  Fund of Knowledge: Good  Language: Good  Assets:  Desire for Improvement Resilience Social Support  ADL's:  Intact  Cognition: WNL  Prognosis:  Good    DIAGNOSES:    ICD-10-CM   1. Recurrent major depression in partial remission (HCC)  F33.41 buPROPion (WELLBUTRIN XL) 150 MG 24 hr tablet    2. Autism spectrum disorder  F84.0     3. Generalized anxiety disorder  F41.1 buPROPion (WELLBUTRIN XL) 150 MG 24 hr tablet    4. Attention deficit hyperactivity disorder (ADHD), combined type  F90.2 amphetamine-dextroamphetamine (ADDERALL XR) 20 MG 24 hr capsule    5. Unspecified mood (affective) disorder (HCC)  F39 buPROPion (WELLBUTRIN XL) 150 MG 24 hr tablet      Receiving Psychotherapy: No    RECOMMENDATIONS:   Greater than 50% of  30  min face to face time with patient was spent on counseling and coordination of care. We discussed patients concern over insurance coverage. He will have to enroll in [place]. Will be checking with [facility] to see if they will accept. We discussed his current stability as well as report of increased seasonal depression.  To start Wellbutrin 150 mg XL daily Continue Adderall 20  XR daily in the am after breakfast Continue Lexapro to 20 mg.   Will follow up in 2 months to reevaluate progress or pt will be getting care in another practice with medicaid . Will report any side effects or worsening symptoms.  Will continue psychotherapy Provided emergency contact information  Reviewed PDMP     Joan Flores, NP

## 2023-07-15 ENCOUNTER — Other Ambulatory Visit: Payer: Self-pay

## 2023-07-21 ENCOUNTER — Other Ambulatory Visit: Payer: Self-pay

## 2023-08-15 ENCOUNTER — Other Ambulatory Visit (HOSPITAL_COMMUNITY): Payer: Self-pay

## 2023-08-15 ENCOUNTER — Other Ambulatory Visit (HOSPITAL_BASED_OUTPATIENT_CLINIC_OR_DEPARTMENT_OTHER): Payer: Self-pay

## 2023-09-10 ENCOUNTER — Other Ambulatory Visit: Payer: Self-pay | Admitting: Behavioral Health

## 2023-09-10 ENCOUNTER — Other Ambulatory Visit (HOSPITAL_BASED_OUTPATIENT_CLINIC_OR_DEPARTMENT_OTHER): Payer: Self-pay

## 2023-09-10 ENCOUNTER — Other Ambulatory Visit: Payer: Self-pay | Admitting: Family Medicine

## 2023-09-10 DIAGNOSIS — F411 Generalized anxiety disorder: Secondary | ICD-10-CM

## 2023-09-10 DIAGNOSIS — F39 Unspecified mood [affective] disorder: Secondary | ICD-10-CM

## 2023-09-10 DIAGNOSIS — F3341 Major depressive disorder, recurrent, in partial remission: Secondary | ICD-10-CM

## 2023-09-10 MED ORDER — BUPROPION HCL ER (XL) 150 MG PO TB24
150.0000 mg | ORAL_TABLET | Freq: Every day | ORAL | 0 refills | Status: DC
Start: 2023-09-10 — End: 2023-10-16
  Filled 2023-09-10: qty 30, 30d supply, fill #0

## 2023-09-10 MED ORDER — VITAMIN D 50 MCG (2000 UT) PO TABS
2000.0000 [IU] | ORAL_TABLET | Freq: Every day | ORAL | 4 refills | Status: AC
  Filled 2023-09-10: qty 100, 100d supply, fill #0
  Filled 2024-03-07 – 2024-04-29 (×4): qty 100, 100d supply, fill #1
  Filled 2024-07-06 – 2024-08-23 (×2): qty 100, 100d supply, fill #2

## 2023-09-10 NOTE — Telephone Encounter (Signed)
LV 09/10 LF 10/12 NV not scheduled

## 2023-09-10 NOTE — Telephone Encounter (Signed)
Last refills by historical provider  ?

## 2023-09-10 NOTE — Telephone Encounter (Signed)
Please schedule pt appt.  

## 2023-09-12 ENCOUNTER — Other Ambulatory Visit (HOSPITAL_BASED_OUTPATIENT_CLINIC_OR_DEPARTMENT_OTHER): Payer: Self-pay

## 2023-10-16 ENCOUNTER — Other Ambulatory Visit: Payer: Self-pay | Admitting: Behavioral Health

## 2023-10-16 ENCOUNTER — Other Ambulatory Visit (HOSPITAL_BASED_OUTPATIENT_CLINIC_OR_DEPARTMENT_OTHER): Payer: Self-pay

## 2023-10-16 DIAGNOSIS — F3341 Major depressive disorder, recurrent, in partial remission: Secondary | ICD-10-CM

## 2023-10-16 DIAGNOSIS — F411 Generalized anxiety disorder: Secondary | ICD-10-CM

## 2023-10-16 DIAGNOSIS — F39 Unspecified mood [affective] disorder: Secondary | ICD-10-CM

## 2023-10-16 NOTE — Telephone Encounter (Signed)
Patient due for FU, but last visit he noted he may have to switch to Medicaid and would not be able to continue to follow here. Sent MyChart message to see if he had another provider.

## 2023-10-19 ENCOUNTER — Other Ambulatory Visit (HOSPITAL_BASED_OUTPATIENT_CLINIC_OR_DEPARTMENT_OTHER): Payer: Self-pay

## 2023-10-19 MED ORDER — BUPROPION HCL ER (XL) 150 MG PO TB24
150.0000 mg | ORAL_TABLET | Freq: Every day | ORAL | 0 refills | Status: DC
Start: 2023-10-19 — End: 2023-11-19
  Filled 2023-10-19: qty 30, 30d supply, fill #0

## 2023-10-19 NOTE — Telephone Encounter (Signed)
LVM per DPR asking if patient is going to continue to follow with [name] or if he has another provider.

## 2023-11-09 ENCOUNTER — Encounter (INDEPENDENT_AMBULATORY_CARE_PROVIDER_SITE_OTHER): Payer: Self-pay

## 2023-11-09 ENCOUNTER — Ambulatory Visit (INDEPENDENT_AMBULATORY_CARE_PROVIDER_SITE_OTHER): Payer: Medicaid Other

## 2023-11-09 VITALS — BP 132/81 | HR 79 | Ht 67.0 in | Wt 157.0 lb

## 2023-11-09 DIAGNOSIS — R0981 Nasal congestion: Secondary | ICD-10-CM

## 2023-11-09 DIAGNOSIS — J310 Chronic rhinitis: Secondary | ICD-10-CM | POA: Diagnosis not present

## 2023-11-09 DIAGNOSIS — J338 Other polyp of sinus: Secondary | ICD-10-CM

## 2023-11-10 DIAGNOSIS — J310 Chronic rhinitis: Secondary | ICD-10-CM | POA: Insufficient documentation

## 2023-11-10 DIAGNOSIS — J338 Other polyp of sinus: Secondary | ICD-10-CM | POA: Insufficient documentation

## 2023-11-10 NOTE — Progress Notes (Signed)
 Patient ID: Patrick Burnett, male   DOB: [birth year], 28 y.o.   MRN: [id number]  Follow-up: Chronic nasal obstruction, right antrochoanal polyp  HPI: The patient is a 28 year old male who returns today for his follow-up evaluation.  The patient was previously seen for chronic nasal obstruction, secondary to nasal septal deviation, bilateral inferior turbinate hypertrophy, and a large right antrochoanal polyp.  He underwent septoplasty, turbinate reduction, and right endoscopic sinus surgery in May 2024.  The patient returns today reporting significant improvement in his nasal breathing.  He has only noted mild nasal congestion during the allergy seasons.  Currently he denies any facial pain, fever, or visual change.  Exam: General: Communicates without difficulty, well nourished, no acute distress. Head: Normocephalic, no evidence injury, no tenderness, facial buttresses intact without stepoff. Face/sinus: No tenderness to palpation and percussion. Facial movement is normal and symmetric. Eyes: PERRL, EOMI. No scleral icterus, conjunctivae clear. Neuro: CN II exam reveals vision grossly intact.  No nystagmus at any point of gaze. Ears: Auricles well formed without lesions.  Ear canals are intact without mass or lesion.  No erythema or edema is appreciated.  The TMs are intact without fluid. Nose: External evaluation reveals normal support and skin without lesions.  Dorsum is intact.  Anterior rhinoscopy reveals congested mucosa over anterior aspect of inferior turbinates and intact septum.  No purulence or polyp noted. Oral:  Oral cavity and oropharynx are intact, symmetric, without erythema or edema.  Mucosa is moist without lesions. Neck: Full range of motion without pain.  There is no significant lymphadenopathy.  No masses palpable.  Thyroid  bed within normal limits to palpation.  Parotid glands and submandibular glands equal bilaterally without mass.  Trachea is midline. Neuro:  CN 2-12 grossly intact.    Assessment: 1.  Chronic rhinitis with moderate nasal mucosal congestion. 2.  His septum and turbinates are well-healed. 3.  No recurrent polyposis is noted.  Plan: 1.  The physical exam findings are reviewed with the patient. 2.  The patient is reassured that no recurrent polyposis is noted today. 3.  Flonase  nasal spray as needed. 4.  The patient will return for reevaluation in 6 months.

## 2023-11-16 ENCOUNTER — Encounter: Payer: Self-pay | Admitting: Family Medicine

## 2023-11-16 ENCOUNTER — Other Ambulatory Visit: Payer: Self-pay | Admitting: Behavioral Health

## 2023-11-16 ENCOUNTER — Other Ambulatory Visit (HOSPITAL_BASED_OUTPATIENT_CLINIC_OR_DEPARTMENT_OTHER): Payer: Self-pay

## 2023-11-16 DIAGNOSIS — F411 Generalized anxiety disorder: Secondary | ICD-10-CM

## 2023-11-16 DIAGNOSIS — F39 Unspecified mood [affective] disorder: Secondary | ICD-10-CM

## 2023-11-16 DIAGNOSIS — F331 Major depressive disorder, recurrent, moderate: Secondary | ICD-10-CM

## 2023-11-16 DIAGNOSIS — F3341 Major depressive disorder, recurrent, in partial remission: Secondary | ICD-10-CM

## 2023-11-16 NOTE — Telephone Encounter (Signed)
 Spoke with patient advise to get in contact with Joan Flores, NP office for refills  Patient verbalized understanding

## 2023-11-18 ENCOUNTER — Other Ambulatory Visit: Payer: Self-pay

## 2023-11-18 ENCOUNTER — Other Ambulatory Visit: Payer: Self-pay | Admitting: Behavioral Health

## 2023-11-18 DIAGNOSIS — F411 Generalized anxiety disorder: Secondary | ICD-10-CM

## 2023-11-18 NOTE — Telephone Encounter (Signed)
 Pt also needs his wellbutrin   refilled as well. Pt has an appt tomorrow

## 2023-11-19 ENCOUNTER — Other Ambulatory Visit (HOSPITAL_BASED_OUTPATIENT_CLINIC_OR_DEPARTMENT_OTHER): Payer: Self-pay

## 2023-11-19 ENCOUNTER — Ambulatory Visit (INDEPENDENT_AMBULATORY_CARE_PROVIDER_SITE_OTHER): Payer: Medicaid Other | Admitting: Behavioral Health

## 2023-11-19 ENCOUNTER — Other Ambulatory Visit: Payer: Self-pay

## 2023-11-19 ENCOUNTER — Encounter: Payer: Self-pay | Admitting: Behavioral Health

## 2023-11-19 DIAGNOSIS — F902 Attention-deficit hyperactivity disorder, combined type: Secondary | ICD-10-CM

## 2023-11-19 DIAGNOSIS — F3341 Major depressive disorder, recurrent, in partial remission: Secondary | ICD-10-CM

## 2023-11-19 DIAGNOSIS — F411 Generalized anxiety disorder: Secondary | ICD-10-CM | POA: Diagnosis not present

## 2023-11-19 DIAGNOSIS — F39 Unspecified mood [affective] disorder: Secondary | ICD-10-CM

## 2023-11-19 DIAGNOSIS — F331 Major depressive disorder, recurrent, moderate: Secondary | ICD-10-CM

## 2023-11-19 MED ORDER — BUPROPION HCL ER (XL) 150 MG PO TB24
150.0000 mg | ORAL_TABLET | Freq: Every day | ORAL | 0 refills | Status: DC
  Filled 2023-11-19: qty 30, 30d supply, fill #0

## 2023-11-19 MED ORDER — ESCITALOPRAM OXALATE 20 MG PO TABS
20.0000 mg | ORAL_TABLET | Freq: Every day | ORAL | 3 refills | Status: DC
  Filled 2023-11-19: qty 90, 90d supply, fill #0
  Filled 2024-03-07: qty 90, 90d supply, fill #1

## 2023-11-19 MED ORDER — BUPROPION HCL ER (XL) 150 MG PO TB24
150.0000 mg | ORAL_TABLET | Freq: Every day | ORAL | 1 refills | Status: DC
  Filled 2024-01-13: qty 90, 90d supply, fill #0

## 2023-11-19 MED ORDER — AMPHETAMINE-DEXTROAMPHET ER 20 MG PO CP24
20.0000 mg | ORAL_CAPSULE | Freq: Every day | ORAL | 0 refills | Status: DC
  Filled 2023-11-19: qty 90, 90d supply, fill #0

## 2023-11-19 NOTE — Progress Notes (Signed)
[facility]  Patient ID: Patrick Burnett,  MRN: [id number]  PCP: Ardith Dark, MD  Date of Evaluation: 11/19/2023 Time spent:30 minutes  Chief Complaint:  Chief Complaint   Anxiety; Depression; Follow-up; Medication Refill; Patient Education     HISTORY/CURRENT STATUS: HPI 28 year old male presents to this office for follow up and medication refill.  Says that he ran out of medication and needed an appointment.  Will be restarting medication today. He has been very stable. Adderall is working very well, taking as needed.  He is sleeping 7-8 hours per night. No mania, No current SI or HI.    No past psychiatric medication: Vyvanse Concerta Methylphenidate Individual Medical History/ Review of Systems: Changes? :No   Allergies: Amoxicillin, Penicillins, Risperidone, and Risperidone and related  Current Medications:  Current Outpatient Medications:    amphetamine-dextroamphetamine (ADDERALL XR) 20 MG 24 hr capsule, Take 1 capsule (20 mg total) by mouth daily., Disp: 90 capsule, Rfl: 0   buPROPion (WELLBUTRIN XL) 150 MG 24 hr tablet, Take 1 tablet (150 mg total) by mouth daily., Disp: 90 tablet, Rfl: 1   Cholecalciferol (VITAMIN D) 50 MCG (2000 UT) tablet, Take 1 tablet (2,000 Units total) by mouth daily in the afternoon., Disp: 100 tablet, Rfl: 4   escitalopram (LEXAPRO) 20 MG tablet, Take 1 tablet (20 mg total) by mouth daily., Disp: 90 tablet, Rfl: 3   predniSONE (DELTASONE) 10 MG tablet, Taper as directed (Patient not taking: Reported on 11/09/2023), Disp: 21 tablet, Rfl: 0 Medication Side Effects: none  Family Medical/ Social History: Changes? No  MENTAL HEALTH EXAM:  There were no vitals taken for this visit.There is no height or weight on file to calculate BMI.  General Appearance: Casual and Neat  Eye Contact:  Good  Speech:  Clear and Coherent  Volume:  Normal  Mood:  NA  Affect:  Appropriate  Thought Process:  Coherent  Orientation:  Full (Time,  Place, and Person)  Thought Content: Logical   Suicidal Thoughts:  No  Homicidal Thoughts:  No  Memory:  WNL  Judgement:  Good  Insight:  Good  Psychomotor Activity:  Normal  Concentration:  Concentration: Good  Recall:  Good  Fund of Knowledge: Good  Language: Good  Assets:  Desire for Improvement  ADL's:  Intact  Cognition: WNL  Prognosis:  Good    DIAGNOSES:    ICD-10-CM   1. Recurrent major depression in partial remission (HCC)  F33.41 buPROPion (WELLBUTRIN XL) 150 MG 24 hr tablet    escitalopram (LEXAPRO) 20 MG tablet    2. Generalized anxiety disorder  F41.1 buPROPion (WELLBUTRIN XL) 150 MG 24 hr tablet    escitalopram (LEXAPRO) 20 MG tablet    3. Unspecified mood (affective) disorder (HCC)  F39 buPROPion (WELLBUTRIN XL) 150 MG 24 hr tablet    4. Major depressive disorder, recurrent episode, moderate (HCC)  F33.1 escitalopram (LEXAPRO) 20 MG tablet    5. Attention deficit hyperactivity disorder (ADHD), combined type  F90.2 amphetamine-dextroamphetamine (ADDERALL XR) 20 MG 24 hr capsule      Receiving Psychotherapy: No    RECOMMENDATIONS:   Greater than 50% of  30  min face to face time with patient was spent on counseling and coordination of care. We discussed his current stability. He is happy with medication and requesting no medication changes today.   To start Wellbutrin 150 mg XL daily Continue Adderall 20 XR daily in the am after breakfast Continue Lexapro to 20 mg.  Will follow up in 3 months  Will report any side effects or worsening symptoms.  Will continue psychotherapy Provided emergency contact information  Reviewed PDMP    Joan Flores, NP

## 2023-11-20 ENCOUNTER — Other Ambulatory Visit (HOSPITAL_BASED_OUTPATIENT_CLINIC_OR_DEPARTMENT_OTHER): Payer: Self-pay

## 2023-11-20 ENCOUNTER — Ambulatory Visit: Payer: Medicaid Other | Admitting: Mental Health

## 2023-11-20 DIAGNOSIS — F3341 Major depressive disorder, recurrent, in partial remission: Secondary | ICD-10-CM | POA: Diagnosis not present

## 2023-11-20 DIAGNOSIS — F411 Generalized anxiety disorder: Secondary | ICD-10-CM
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
## 2023-11-23 ENCOUNTER — Other Ambulatory Visit (HOSPITAL_BASED_OUTPATIENT_CLINIC_OR_DEPARTMENT_OTHER): Payer: Self-pay

## 2023-12-11 ENCOUNTER — Ambulatory Visit: Payer: Medicaid Other | Admitting: Mental Health

## 2023-12-11 NOTE — Progress Notes (Signed)
 No charge for 1st no show 12/11/23.

## 2023-12-25 ENCOUNTER — Ambulatory Visit (INDEPENDENT_AMBULATORY_CARE_PROVIDER_SITE_OTHER): Payer: Self-pay | Admitting: Mental Health

## 2023-12-25 DIAGNOSIS — Z0389 Encounter for observation for other suspected diseases and conditions ruled out: Secondary | ICD-10-CM

## 2023-12-25 NOTE — Progress Notes (Signed)
No show charge for missed session 12/25/23.

## 2024-01-13 ENCOUNTER — Other Ambulatory Visit (HOSPITAL_COMMUNITY): Payer: Self-pay

## 2024-01-13 ENCOUNTER — Other Ambulatory Visit: Payer: Self-pay

## 2024-01-22 ENCOUNTER — Ambulatory Visit: Payer: Medicaid Other | Admitting: Behavioral Health

## 2024-01-22 ENCOUNTER — Other Ambulatory Visit (HOSPITAL_COMMUNITY): Payer: Self-pay

## 2024-01-22 ENCOUNTER — Encounter: Payer: Self-pay | Admitting: Behavioral Health

## 2024-01-22 ENCOUNTER — Other Ambulatory Visit: Payer: Self-pay | Admitting: Behavioral Health

## 2024-01-22 DIAGNOSIS — F840 Autistic disorder: Secondary | ICD-10-CM | POA: Diagnosis not present

## 2024-01-22 DIAGNOSIS — F524 Premature ejaculation: Secondary | ICD-10-CM | POA: Diagnosis not present

## 2024-01-22 DIAGNOSIS — F902 Attention-deficit hyperactivity disorder, combined type: Secondary | ICD-10-CM

## 2024-01-22 DIAGNOSIS — F411 Generalized anxiety disorder: Secondary | ICD-10-CM

## 2024-01-22 DIAGNOSIS — F3341 Major depressive disorder, recurrent, in partial remission: Secondary | ICD-10-CM | POA: Diagnosis not present

## 2024-01-22 DIAGNOSIS — F39 Unspecified mood [affective] disorder: Secondary | ICD-10-CM

## 2024-01-22 MED ORDER — AMPHETAMINE-DEXTROAMPHET ER 15 MG PO CP24
15.0000 mg | ORAL_CAPSULE | ORAL | 0 refills | Status: DC
  Filled 2024-01-22: qty 30, 30d supply, fill #0

## 2024-01-22 MED ORDER — BUPROPION HCL ER (XL) 150 MG PO TB24
150.0000 mg | ORAL_TABLET | Freq: Every day | ORAL | 1 refills | Status: DC
  Filled 2024-01-22: qty 90, 90d supply, fill #0

## 2024-01-22 MED ORDER — PAROXETINE HCL 10 MG PO TABS
10.0000 mg | ORAL_TABLET | Freq: Every day | ORAL | 1 refills | Status: DC
  Filled 2024-01-22: qty 30, 30d supply, fill #0
  Filled 2024-02-10 – 2024-02-15 (×3): qty 30, 30d supply, fill #1

## 2024-01-22 NOTE — Progress Notes (Signed)
 [facility]  Patient ID: TAHJI [place],  MRN: [id number]  PCP: Ardith Dark, MD  Date of Evaluation: 01/22/2024 Time spent:40 minutes  Chief Complaint:  Chief Complaint   Anxiety; Depression; Follow-up; Medication Refill; Medication Problem; Patient Education; Premature Ejaculation; ADHD     HISTORY/CURRENT STATUS: HPI 28 year old male presents to this office for follow up and medication refill.  Says his anxiety and depression have been very stable but he has been experiencing very problematic premature ejaculation. Says that it severe and that everything he has tried including condoms, desensitizing creams and other non RX products have failed. AD's have also failed to cause delay. He is questioning if there are other pharmaceutical solutions.  Says that it is negatively effecting his relationship.  Adderall is working very well, taking as needed.  He is sleeping 7-8 hours per night. No mania, No current SI or HI.    No past psychiatric medication: Vyvanse Concerta Methylphenidate Individual Medical History/ Review of Systems: Changes? :No   Allergies: Amoxicillin, Penicillins, Risperidone, and Risperidone and related  Current Medications:  Current Outpatient Medications:    amphetamine-dextroamphetamine (ADDERALL XR) 15 MG 24 hr capsule, Take 1 capsule by mouth every morning., Disp: 30 capsule, Rfl: 0   PARoxetine (PAXIL) 10 MG tablet, Take 1 tablet (10 mg total) by mouth daily., Disp: 30 tablet, Rfl: 1   amphetamine-dextroamphetamine (ADDERALL XR) 20 MG 24 hr capsule, Take 1 capsule (20 mg total) by mouth daily., Disp: 90 capsule, Rfl: 0   buPROPion (WELLBUTRIN XL) 150 MG 24 hr tablet, Take 1 tablet (150 mg total) by mouth daily., Disp: 30 tablet, Rfl: 0   buPROPion (WELLBUTRIN XL) 150 MG 24 hr tablet, Take 1 tablet (150 mg total) by mouth daily., Disp: 90 tablet, Rfl: 1   Cholecalciferol (VITAMIN D) 50 MCG (2000 UT) tablet, Take 1 tablet (2,000 Units total) by  mouth daily in the afternoon., Disp: 100 tablet, Rfl: 4   escitalopram (LEXAPRO) 20 MG tablet, Take 1 tablet (20 mg total) by mouth daily., Disp: 90 tablet, Rfl: 3   predniSONE (DELTASONE) 10 MG tablet, Taper as directed (Patient not taking: Reported on 11/09/2023), Disp: 21 tablet, Rfl: 0 Medication Side Effects: none  Family Medical/ Social History: Changes? No  MENTAL HEALTH EXAM:  There were no vitals taken for this visit.There is no height or weight on file to calculate BMI.  General Appearance: Casual, Neat, and Well Groomed  Eye Contact:  Good  Speech:  Clear and Coherent  Volume:  Normal  Mood:  NA  Affect:  Appropriate  Thought Process:  Coherent  Orientation:  Full (Time, Place, and Person)  Thought Content: Logical   Suicidal Thoughts:  No  Homicidal Thoughts:  No  Memory:  WNL  Judgement:  Good  Insight:  Good  Psychomotor Activity:  Normal  Concentration:  Concentration: Good  Recall:  Good  Fund of Knowledge: Good  Language: Good  Assets:  Desire for Improvement  ADL's:  Intact  Cognition: WNL  Prognosis:  Good    DIAGNOSES:    ICD-10-CM   1. Premature ejaculation  F52.4 PARoxetine (PAXIL) 10 MG tablet    2. Generalized anxiety disorder  F41.1 PARoxetine (PAXIL) 10 MG tablet    buPROPion (WELLBUTRIN XL) 150 MG 24 hr tablet    3. Recurrent major depression in partial remission (HCC)  F33.41 PARoxetine (PAXIL) 10 MG tablet    buPROPion (WELLBUTRIN XL) 150 MG 24 hr tablet    4. Autism  spectrum disorder  F84.0     5. Attention deficit hyperactivity disorder (ADHD), combined type  F90.2 amphetamine-dextroamphetamine (ADDERALL XR) 15 MG 24 hr capsule    6. Unspecified mood (affective) disorder (HCC)  F39 buPROPion (WELLBUTRIN XL) 150 MG 24 hr tablet      Receiving Psychotherapy: No    RECOMMENDATIONS:   Greater than 50% of  30  min face to face time with patient was spent on counseling and coordination of care. We discussed his current stability. He is  very frustrated for continued problems with severe premature ejaculation. Says that sometime he does not even reach penetration before occurrence. We discussed his failed trials of non RX solutions and also new trial of Paxil. There is EBR that suggest Paxil may be effective. He agrees to trial.   To start Wellbutrin 150 mg XL daily Continue Adderall 20 XR daily in the am after breakfast To decrease Lexapro to 10 mg for one week, then 5 mg for one week and then stop To start Paxil 10 mg daily in the am Will follow up in 4 weeks Will report any side effects or worsening symptoms.  Will continue psychotherapy Provided emergency contact information  Reviewed PDMP     Joan Flores, NP

## 2024-01-23 ENCOUNTER — Other Ambulatory Visit (HOSPITAL_BASED_OUTPATIENT_CLINIC_OR_DEPARTMENT_OTHER): Payer: Self-pay

## 2024-02-06 ENCOUNTER — Other Ambulatory Visit (HOSPITAL_COMMUNITY): Payer: Self-pay

## 2024-02-10 ENCOUNTER — Other Ambulatory Visit (HOSPITAL_COMMUNITY): Payer: Self-pay

## 2024-02-11 ENCOUNTER — Ambulatory Visit: Admitting: Mental Health

## 2024-02-11 DIAGNOSIS — F902 Attention-deficit hyperactivity disorder, combined type: Secondary | ICD-10-CM
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
## 2024-02-13 ENCOUNTER — Other Ambulatory Visit (HOSPITAL_COMMUNITY): Payer: Self-pay

## 2024-02-23 ENCOUNTER — Ambulatory Visit: Admitting: Behavioral Health

## 2024-02-29 ENCOUNTER — Ambulatory Visit (INDEPENDENT_AMBULATORY_CARE_PROVIDER_SITE_OTHER): Admitting: Behavioral Health

## 2024-02-29 ENCOUNTER — Other Ambulatory Visit (HOSPITAL_COMMUNITY): Payer: Self-pay

## 2024-02-29 ENCOUNTER — Encounter: Payer: Self-pay | Admitting: Behavioral Health

## 2024-02-29 DIAGNOSIS — F3341 Major depressive disorder, recurrent, in partial remission: Secondary | ICD-10-CM

## 2024-02-29 DIAGNOSIS — F39 Unspecified mood [affective] disorder: Secondary | ICD-10-CM

## 2024-02-29 DIAGNOSIS — F411 Generalized anxiety disorder: Secondary | ICD-10-CM

## 2024-02-29 DIAGNOSIS — F331 Major depressive disorder, recurrent, moderate: Secondary | ICD-10-CM | POA: Diagnosis not present

## 2024-02-29 MED ORDER — PAROXETINE HCL 20 MG PO TABS
20.0000 mg | ORAL_TABLET | Freq: Every day | ORAL | 2 refills | Status: DC
  Filled 2024-02-29: qty 30, 30d supply, fill #0
  Filled 2024-03-26 – 2024-04-07 (×2): qty 30, 30d supply, fill #1
  Filled 2024-04-29: qty 30, 30d supply, fill #2

## 2024-02-29 MED ORDER — BUPROPION HCL ER (XL) 150 MG PO TB24
150.0000 mg | ORAL_TABLET | Freq: Every day | ORAL | 1 refills | Status: DC
  Filled 2024-02-29 – 2024-03-23 (×4): qty 90, 90d supply, fill #0
  Filled 2024-04-07 – 2024-04-29 (×2): qty 90, 90d supply, fill #1

## 2024-02-29 NOTE — Progress Notes (Signed)
 [facility]  Patient ID: Patrick Burnett,  MRN: [id number]  PCP: [name], MD  Date of Evaluation: 02/29/2024 Time spent:30 minutes  Chief Complaint:  Chief Complaint   Anxiety; ADHD; Depression; Follow-up; Medication Refill; Patient Education     HISTORY/CURRENT STATUS: HPI  28 year old male presents to this office for follow up and medication refill.  Overall seen some improvement since switching to Paxil  but anxiety is still an issue. He would like to consider increasing  his dosage. He is not sure but feels like there has been some mild improvement in premature ejaculations.  Adderall is working very well, taking as needed.  He is sleeping 7-8 hours per night. No mania, No current SI or HI.    No past psychiatric medication: Vyvanse Concerta Methylphenidate    Individual Medical History/ Review of Systems: Changes? :No   Allergies: Amoxicillin, Penicillins, Risperidone , and Risperidone  and related  Current Medications:  Current Outpatient Medications:    PARoxetine  (PAXIL ) 20 MG tablet, Take 1 tablet (20 mg total) by mouth daily., Disp: 30 tablet, Rfl: 2   amphetamine -dextroamphetamine  (ADDERALL XR) 15 MG 24 hr capsule, Take 1 capsule by mouth every morning., Disp: 30 capsule, Rfl: 0   amphetamine -dextroamphetamine  (ADDERALL XR) 20 MG 24 hr capsule, Take 1 capsule (20 mg total) by mouth daily., Disp: 90 capsule, Rfl: 0   buPROPion  (WELLBUTRIN  XL) 150 MG 24 hr tablet, Take 1 tablet (150 mg total) by mouth daily., Disp: 30 tablet, Rfl: 0   buPROPion  (WELLBUTRIN  XL) 150 MG 24 hr tablet, Take 1 tablet (150 mg total) by mouth daily., Disp: 90 tablet, Rfl: 1   Cholecalciferol  (VITAMIN D ) 50 MCG (2000 UT) tablet, Take 1 tablet (2,000 Units total) by mouth daily in the afternoon., Disp: 100 tablet, Rfl: 4   escitalopram  (LEXAPRO ) 20 MG tablet, Take 1 tablet (20 mg total) by mouth daily., Disp: 90 tablet, Rfl: 3   PARoxetine  (PAXIL ) 10 MG tablet, Take 1 tablet  (10 mg total) by mouth daily., Disp: 30 tablet, Rfl: 1   predniSONE  (DELTASONE ) 10 MG tablet, Taper as directed (Patient not taking: Reported on 11/09/2023), Disp: 21 tablet, Rfl: 0 Medication Side Effects: none  Family Medical/ Social History: Changes? No  MENTAL HEALTH EXAM:  There were no vitals taken for this visit.There is no height or weight on file to calculate BMI.  General Appearance: Casual, Neat, and Well Groomed  Eye Contact:  NA  Speech:  Clear and Coherent  Volume:  Normal  Mood:  NA  Affect:  Appropriate  Thought Process:  Coherent  Orientation:  Full (Time, Place, and Person)  Thought Content: Logical   Suicidal Thoughts:  No  Homicidal Thoughts:  No  Memory:  WNL  Judgement:  Good  Insight:  Good  Psychomotor Activity:  Normal  Concentration:  Concentration: Good  Recall:  Good  Fund of Knowledge: Good  Language: Good  Assets:  Desire for Improvement  ADL's:  Intact  Cognition: WNL  Prognosis:  Good    DIAGNOSES:    ICD-10-CM   1. Generalized anxiety disorder  F41.1 PARoxetine  (PAXIL ) 20 MG tablet    buPROPion  (WELLBUTRIN  XL) 150 MG 24 hr tablet    2. Major depressive disorder, recurrent episode, moderate (HCC)  F33.1 PARoxetine  (PAXIL ) 20 MG tablet    3. Recurrent major depression in partial remission (HCC)  F33.41 buPROPion  (WELLBUTRIN  XL) 150 MG 24 hr tablet    4. Unspecified mood (affective) disorder (HCC)  F39 buPROPion  (  WELLBUTRIN  XL) 150 MG 24 hr tablet      Receiving Psychotherapy: No    RECOMMENDATIONS:   Greater than 50% of  30  min face to face time with patient was spent on counseling and coordination of care. Discussed his mild improvement with depression since changing to Paxil  but anxiety is still uncomfortable level.  Maybe mild improvement with premature ejaculation.   We agreed: Continue Wellbutrin  150 mg XL daily Continue Adderall 20 XR daily in the am after breakfast To increase Paxil  to 20 mg daily in the am Will follow up  in 8 weeks Will report any side effects or worsening symptoms.  Will continue psychotherapy Provided emergency contact information  Reviewed PDMP  Lincoln Renshaw, NP

## 2024-03-01 ENCOUNTER — Ambulatory Visit (INDEPENDENT_AMBULATORY_CARE_PROVIDER_SITE_OTHER): Admitting: Mental Health

## 2024-03-01 DIAGNOSIS — F902 Attention-deficit hyperactivity disorder, combined type: Secondary | ICD-10-CM

## 2024-03-01 DIAGNOSIS — F411 Generalized anxiety disorder: Secondary | ICD-10-CM

## 2024-03-01 NOTE — Progress Notes (Signed)
 [facility] Psychotherapy Note  Name: Patrick Burnett Date: 03/01/2024 MRN: [id number] DOB: 1996-08-30 PCP: Patrick Clamp, MD  Time spent:  49 minutes  Treatment:  ind. therapy  Mental Status Exam:    Appearance:    Casual     Behavior:   Appropriate  Motor:   WNL  Speech/Language:    Clear and Coherent  Affect:   Full range   Mood:   Euthymic  Thought process:   Logical, linear, goal directed  Thought content:     WNL  Sensory/Perceptual disturbances:     none  Orientation:   x4  Attention:   Good  Concentration:   Good  Memory:   Intact  Fund of knowledge:    Consistent with age and development  Insight:     Good  Judgment:    Good  Impulse Control:   Good     Reported Symptoms: Anxiety, intermittent depressed mood, ADHD symptoms (distractibility, problems with focus and sustained attention)   Risk Assessment: Danger to Self:  No Self-injurious Behavior: No Danger to Others: No Duty to Warn:no Physical Aggression / Violence:No  Access to Firearms a concern: No  Gang Involvement:No  Patient / guardian was educated about steps to take if suicide or homicide risk level increases between visits: yes While future psychiatric events cannot be accurately predicted, the patient does not currently require acute inpatient psychiatric care and does not currently meet [place]  involuntary commitment criteria.    Subjective:  Assessed progress. He shared more context related to family. His being close with his father, this relationship improving over the last couple of years as he said he's come to understand his father more. He stated that growing up his father was often emotionally absent he knows now due to his being on the autistic spectrum. He stayed at his father also coat with ADHD, feels they have many genetic similarities. He said that his sister and mother loved together, feels his mother and hebels his sister as she's age 73 and continues to rely on her  financially, live with her. He shared insights He has while he was also living with him until about a year ago prior to moving in to the apartment with his girlfriend. You standing he's become more aware of his negative thought patterns, tendencies that he had more pronounced throughout most of his life. The share his efforts to work on reframing these thoughts and how to continue was explore collaboratively in session. He said that as a result he feels it continues to help us  mood particularly in his anxiety. He shared his plans regarding education in career, plans to hopefully get an internship over the summer to gain more experience and create opportunities.  Interventions:   supportive therapy, motivational interviewing  Diagnoses:    ICD-10-CM   1. Generalized anxiety disorder  F41.1     2. Attention deficit hyperactivity disorder (ADHD), combined type  F90.2          Plan: Patient to follow through with coping as identified in session, continue to utilize his support system which is primarily his girlfriend for support and some other friends.  Long-term goal:  Reduce overall level, frequency, and intensity of distressful emotions when feeling overwhelmed for at least 3 consecutive months per patient report.                              Identify painful past events  that continue to impact Patrick Burnett emotionally and work to resolve them and improve coping.  Short-term goal: To identify and process feelings related to the disappointment of past painful events that lead to feelings of sadness, anxiety.                   Improve emotional regulatory skills when feeling stressed and overwhelmed.                              Decrease frequency of procrastination    Patrick Burnett, [facility]

## 2024-03-07 ENCOUNTER — Other Ambulatory Visit (HOSPITAL_COMMUNITY): Payer: Self-pay

## 2024-03-08 ENCOUNTER — Other Ambulatory Visit (HOSPITAL_COMMUNITY): Payer: Self-pay

## 2024-03-17 ENCOUNTER — Other Ambulatory Visit: Payer: Self-pay | Admitting: Behavioral Health

## 2024-03-17 DIAGNOSIS — F411 Generalized anxiety disorder: Secondary | ICD-10-CM

## 2024-03-17 DIAGNOSIS — F524 Premature ejaculation: Secondary | ICD-10-CM

## 2024-03-17 DIAGNOSIS — F3341 Major depressive disorder, recurrent, in partial remission: Secondary | ICD-10-CM

## 2024-03-18 ENCOUNTER — Other Ambulatory Visit (HOSPITAL_COMMUNITY): Payer: Self-pay

## 2024-03-21 ENCOUNTER — Other Ambulatory Visit (HOSPITAL_COMMUNITY): Payer: Self-pay

## 2024-03-23 ENCOUNTER — Other Ambulatory Visit (HOSPITAL_COMMUNITY): Payer: Self-pay

## 2024-03-23 ENCOUNTER — Other Ambulatory Visit (HOSPITAL_BASED_OUTPATIENT_CLINIC_OR_DEPARTMENT_OTHER): Payer: Self-pay

## 2024-03-24 ENCOUNTER — Other Ambulatory Visit (HOSPITAL_BASED_OUTPATIENT_CLINIC_OR_DEPARTMENT_OTHER): Payer: Self-pay

## 2024-03-26 ENCOUNTER — Other Ambulatory Visit (HOSPITAL_COMMUNITY): Payer: Self-pay

## 2024-03-31 ENCOUNTER — Ambulatory Visit: Admitting: Mental Health

## 2024-04-07 ENCOUNTER — Other Ambulatory Visit: Payer: Self-pay

## 2024-04-07 ENCOUNTER — Telehealth: Payer: MEDICAID | Admitting: Mental Health

## 2024-04-07 ENCOUNTER — Other Ambulatory Visit (HOSPITAL_COMMUNITY): Payer: Self-pay

## 2024-04-07 ENCOUNTER — Other Ambulatory Visit: Payer: Self-pay | Admitting: Behavioral Health

## 2024-04-07 DIAGNOSIS — F411 Generalized anxiety disorder: Secondary | ICD-10-CM | POA: Diagnosis not present

## 2024-04-07 DIAGNOSIS — F902 Attention-deficit hyperactivity disorder, combined type: Secondary | ICD-10-CM

## 2024-04-07 MED ORDER — AMPHETAMINE-DEXTROAMPHET ER 15 MG PO CP24
15.0000 mg | ORAL_CAPSULE | ORAL | 0 refills | Status: DC
  Filled 2024-04-07: qty 30, 30d supply, fill #0

## 2024-04-07 NOTE — Progress Notes (Signed)
 [facility] Psychotherapy Note  Name: Patrick Burnett Date: 04/07/2024 MRN: [id number] DOB: December 30, 1995 PCP: [name], MD  Time spent:  49 minutes  Treatment:  ind. therapy  Virtual Visit via Telehealth Note Connected with patient by a telemedicine/telehealth application, with their informed consent, and verified patient privacy and that I am speaking with the correct person using two identifiers. I discussed the limitations, risks, security and privacy concerns of performing psychotherapy and the availability of in person appointments. I also discussed with the patient that there may be a patient responsible charge related to this service. The patient expressed understanding and agreed to proceed. I discussed the treatment planning with the patient. The patient was provided an opportunity to ask questions and all were answered. The patient agreed with the plan and demonstrated an understanding of the instructions. The patient was advised to call  our office if  symptoms worsen or feel they are in a crisis state and need immediate contact.   Therapist Location: office Patient Location: home     Mental Status Exam:    Appearance:    Casual     Behavior:   Appropriate  Motor:   WNL  Speech/Language:    Clear and Coherent  Affect:   Full range   Mood:   Euthymic  Thought process:   Logical, linear, goal directed  Thought content:     WNL  Sensory/Perceptual disturbances:     none  Orientation:   x4  Attention:   Good  Concentration:   Good  Memory:   Intact  Fund of knowledge:    Consistent with age and development  Insight:     Good  Judgment:    Good  Impulse Control:   Good     Reported Symptoms: Anxiety, intermittent depressed mood, ADHD symptoms (distractibility, problems with focus and sustained attention)   Risk Assessment: Danger to Self:  No Self-injurious Behavior: No Danger to Others: No Duty to Warn:no Physical Aggression / Violence:No  Access to  Firearms a concern: No  Gang Involvement:No  Patient / guardian was educated about steps to take if suicide or homicide risk level increases between visits: yes While future psychiatric events cannot be accurately predicted, the patient does not currently require acute inpatient psychiatric care and does not currently meet [place]  involuntary commitment criteria.    Subjective:  Patient engaged in telehealth video visit. He said that he and his girlfriend had a pleasant vacation, recently visiting a friend out of state. He went on to share how he finished the last school semester well, earning at 4.0 GPA. He shared more family dynamics, relationships going well overall with family. He stated that he maintains some boundaries with his father at times as his father may make requests of him and patient sharing with his father when he can assist him per his schedule. He said his family has a habit of not planning some activities with enough notice, this also encloses mother and sister. He identified the need to continue to work on his tendency to procrastinate. He wants to start this next semester in summer off well. Explorer collaboratively ways to decrease his tendency to procrastinate, he acknowledged once he get started with most tasks he's engaged and will continue till completion.   Interventions:   supportive therapy, motivational interviewing  Diagnoses:    ICD-10-CM   1. Generalized anxiety disorder  F41.1         Plan: Patient to follow through with coping  as identified in session, continue to utilize his support system which is primarily his girlfriend for support and some other friends.  Long-term goal:  Reduce overall level, frequency, and intensity of distressful emotions when feeling overwhelmed for at least 3 consecutive months per patient report.                              Identify painful past events that continue to impact Patrick Burnett emotionally and work to resolve them and  improve coping.  Short-term goal: To identify and process feelings related to the disappointment of past painful events that lead to feelings of sadness, anxiety.                   Improve emotional regulatory skills when feeling stressed and overwhelmed.                              Decrease frequency of procrastination    Patrick Burnett, [facility]

## 2024-04-09 ENCOUNTER — Encounter (HOSPITAL_COMMUNITY): Payer: Self-pay

## 2024-04-11 ENCOUNTER — Other Ambulatory Visit (HOSPITAL_COMMUNITY): Payer: Self-pay

## 2024-04-11 ENCOUNTER — Other Ambulatory Visit: Payer: Self-pay

## 2024-04-12 ENCOUNTER — Other Ambulatory Visit (HOSPITAL_COMMUNITY): Payer: Self-pay

## 2024-04-19 ENCOUNTER — Encounter: Payer: MEDICAID | Admitting: Family Medicine

## 2024-04-26 ENCOUNTER — Ambulatory Visit (INDEPENDENT_AMBULATORY_CARE_PROVIDER_SITE_OTHER): Payer: MEDICAID | Admitting: Otolaryngology

## 2024-04-27 ENCOUNTER — Ambulatory Visit (INDEPENDENT_AMBULATORY_CARE_PROVIDER_SITE_OTHER): Payer: MEDICAID | Admitting: Family Medicine

## 2024-04-27 ENCOUNTER — Encounter: Payer: Self-pay | Admitting: Family Medicine

## 2024-04-27 VITALS — BP 104/68 | HR 63 | Temp 97.3°F | Ht 67.0 in | Wt 152.4 lb

## 2024-04-27 DIAGNOSIS — Z0001 Encounter for general adult medical examination with abnormal findings: Secondary | ICD-10-CM | POA: Diagnosis not present

## 2024-04-27 DIAGNOSIS — Z13220 Encounter for screening for lipoid disorders: Secondary | ICD-10-CM | POA: Diagnosis not present

## 2024-04-27 DIAGNOSIS — L731 Pseudofolliculitis barbae: Secondary | ICD-10-CM | POA: Diagnosis not present

## 2024-04-27 DIAGNOSIS — Z114 Encounter for screening for human immunodeficiency virus [HIV]: Secondary | ICD-10-CM

## 2024-04-27 DIAGNOSIS — Z23 Encounter for immunization: Secondary | ICD-10-CM | POA: Diagnosis not present

## 2024-04-27 DIAGNOSIS — R198 Other specified symptoms and signs involving the digestive system and abdomen: Secondary | ICD-10-CM | POA: Diagnosis not present

## 2024-04-27 DIAGNOSIS — Z1159 Encounter for screening for other viral diseases: Secondary | ICD-10-CM

## 2024-04-27 DIAGNOSIS — F411 Generalized anxiety disorder: Secondary | ICD-10-CM | POA: Diagnosis not present

## 2024-04-27 DIAGNOSIS — Z131 Encounter for screening for diabetes mellitus: Secondary | ICD-10-CM | POA: Diagnosis not present

## 2024-04-27 DIAGNOSIS — F3341 Major depressive disorder, recurrent, in partial remission: Secondary | ICD-10-CM

## 2024-04-27 LAB — CBC
HCT: 43.5 % (ref 39.0–52.0)
Hemoglobin: 15.3 g/dL (ref 13.0–17.0)
MCHC: 35.2 g/dL (ref 30.0–36.0)
MCV: 89.1 fl (ref 78.0–100.0)
Platelets: 222 10*3/uL (ref 150.0–400.0)
RBC: 4.88 Mil/uL (ref 4.22–5.81)
RDW: 12.1 % (ref 11.5–15.5)
WBC: 4.4 10*3/uL (ref 4.0–10.5)

## 2024-04-27 LAB — HEMOGLOBIN A1C: Hgb A1c MFr Bld: 5 % (ref 4.6–6.5)

## 2024-04-27 LAB — LIPID PANEL
Cholesterol: 154 mg/dL (ref 0–200)
HDL: 41.1 mg/dL (ref >39.00)
LDL Cholesterol: 90 mg/dL (ref 0–99)
NonHDL: 112.72
Total CHOL/HDL Ratio: 4
Triglycerides: 112 mg/dL (ref 0.0–149.0)
VLDL: 22.4 mg/dL (ref 0.0–40.0)

## 2024-04-27 LAB — TSH: TSH: 1.6 u[IU]/mL (ref 0.35–5.50)

## 2024-04-27 LAB — COMPREHENSIVE METABOLIC PANEL WITH GFR
ALT: 20 U/L (ref 0–53)
AST: 20 U/L (ref 0–37)
Albumin: 4.5 g/dL (ref 3.5–5.2)
Alkaline Phosphatase: 72 U/L (ref 39–117)
BUN: 11 mg/dL (ref 6–23)
CO2: 27 meq/L (ref 19–32)
Calcium: 9.4 mg/dL (ref 8.4–10.5)
Chloride: 105 meq/L (ref 96–112)
Creatinine, Ser: 1.05 mg/dL (ref 0.40–1.50)
GFR: 97.79 mL/min (ref >60.00)
Glucose, Bld: 93 mg/dL (ref 70–99)
Potassium: 4 meq/L (ref 3.5–5.1)
Sodium: 140 meq/L (ref 135–145)
Total Bilirubin: 0.7 mg/dL (ref 0.2–1.2)
Total Protein: 6.9 g/dL (ref 6.0–8.3)

## 2024-04-27 NOTE — Progress Notes (Signed)
 Chief Complaint:  Patrick Burnett is a 28 y.o. male who presents today for his annual comprehensive physical exam.    Assessment/Plan:  New/Acute Problems: Ingrown hair No red flags.  Reassured patient.  Recommended against close shaving and that he should use a trimmer tool to leave shorts stubble if he does wish to groom.  Anticipate that this will improve over the next several weeks.  He will let us  know if symptoms do not improve or if he develops any new symptoms.  Right lower quadrant fullness No red flags.  Reassuring exam.  Patient primarily concerned about ruling out colon cancer.  Discussed with patient this is very unlikely for his age group and with his symptoms however we will check FOBT per his request.  Symptoms are currently are very manageable.  Deferred GI referral today however he will let us  know if he develops any new or worsening symptoms.  Chronic Problems Addressed Today: Generalized anxiety disorder Following with psychiatry.  On Paxil  20 mg daily.  Recurrent major depression in partial remission (HCC) Follows with psychiatry.  On Paxil  20 mg daily and Wellbutrin  150 mg daily.  Preventative Healthcare: Check labs.  HPV vaccine given today.  Patient Counseling(The following topics were reviewed and/or handout was given):  -Nutrition: Stressed importance of moderation in sodium/caffeine intake, saturated fat and cholesterol, caloric balance, sufficient intake of fresh fruits, vegetables, and fiber.  -Stressed the importance of regular exercise.   -Substance Abuse: Discussed cessation/primary prevention of tobacco, alcohol, or other drug use; driving or other dangerous activities under the influence; availability of treatment for abuse.   -Injury prevention: Discussed safety belts, safety helmets, smoke detector, smoking near bedding or upholstery.   -Sexuality: Discussed sexually transmitted diseases, partner selection, use of condoms, avoidance of unintended  pregnancy and contraceptive alternatives.   -Dental health: Discussed importance of regular tooth brushing, flossing, and dental visits.  -Health maintenance and immunizations reviewed. Please refer to Health maintenance section.  Return to care in 1 year for next preventative visit.     Subjective:  HPI:  See A/P for status of chronic conditions.  Patient is here today for his annual physical.  He has a few issues he like to discuss today.  He has noticed a few skin lumps in his left groin last few weeks.  Some pain in the area when pushing on it.  No redness.  No discharge.  No treatments tried.  Is also been noticing intermittent fullness in his right lower quadrant.  He does occasionally get diarrhea.  No melena or hematochezia.  No reported nausea or vomiting.  Appetite has been at baseline.  His grandfather had colon cancer and he would like to make sure that he does not have this.  Lifestyle Diet: Balanced. Getting plenty of fruits and vegetables.  Exercise: Getting steps and activity in at work.      01/20/2023    1:25 PM  Depression screen PHQ 2/9  Decreased Interest 0  Down, Depressed, Hopeless 0  PHQ - 2 Score 0  Altered sleeping 2  Tired, decreased energy 2  Change in appetite 0  Feeling bad or failure about yourself  2  Trouble concentrating 3  Moving slowly or fidgety/restless 0  Suicidal thoughts 0  PHQ-9 Score 9  Difficult doing work/chores Somewhat difficult    Health Maintenance Due  Topic Date Due   HPV VACCINES (1 - Male 3-dose series) Never done   HIV Screening  Never done   Hepatitis  C Screening  Never done     ROS: Per HPI, otherwise a complete review of systems was negative.   PMH:  The following were reviewed and entered/updated in epic: Past Medical History:  Diagnosis Date   ADHD (attention deficit hyperactivity disorder) 12/30/2012   Anxiety    Attention deficit hyperactivity disorder (ADHD)    Depression    Mood disorder [facility])     Patient Active Problem List   Diagnosis Date Noted   Polyp of nasal sinus 11/10/2023   Chronic rhinitis 11/10/2023   Allergic rhinitis 04/18/2021   Deafness in left ear 04/18/2021   Generalized anxiety disorder 12/21/2018   Recurrent major depression in partial remission (HCC) 12/31/2012   Insomnia 12/30/2012   Past Surgical History:  Procedure Laterality Date   MAXILLARY ANTROSTOMY Right 03/09/2023   Procedure: RIGHT MAXILLARY ANTROSTOMY WITH TISSUE REMOVAL;  Surgeon: Karis Clunes, MD;  Location: [facility];  Service: ENT;  Laterality: Right;   NASAL SEPTOPLASTY W/ TURBINOPLASTY Bilateral 03/09/2023   Procedure: NASAL SEPTOPLASTY WITH BILATERAL TURBINATE REDUCTION;  Surgeon: Karis Clunes, MD;  Location: [facility];  Service: ENT;  Laterality: Bilateral;   none      Family History  Problem Relation Age of Onset   Hearing loss Mother    Hypercholesterolemia Mother    Anxiety disorder Mother    Sleep apnea Mother    Depression Mother    Depression Father    Diabetes Father    Hypercholesterolemia Father    Anxiety disorder Father    Hypertension Father    Depression Sister    Anxiety disorder Sister    Learning disabilities Sister    Learning disabilities Brother    Hearing loss Maternal Grandmother    Diabetes Maternal Grandfather    Hearing loss Maternal Grandfather    Colon cancer Maternal Grandfather    Hearing loss Paternal Grandmother    Hearing loss Paternal Grandfather    Colon cancer Paternal Grandfather    Stomach cancer Neg Hx    Esophageal cancer Neg Hx    Rectal cancer Neg Hx    Liver cancer Neg Hx     Medications- reviewed and updated Current Outpatient Medications  Medication Sig Dispense Refill   amphetamine -dextroamphetamine  (ADDERALL XR) 15 MG 24 hr capsule Take 1 capsule by mouth every morning. 30 capsule 0   buPROPion  (WELLBUTRIN  XL) 150 MG 24 hr tablet Take 1 tablet (150 mg total) by mouth daily. 90 tablet 1    Cholecalciferol  (VITAMIN D ) 50 MCG (2000 UT) tablet Take 1 tablet (2,000 Units total) by mouth daily in the afternoon. 100 tablet 4   PARoxetine  (PAXIL ) 20 MG tablet Take 1 tablet (20 mg total) by mouth daily. 30 tablet 2   No current facility-administered medications for this visit.    Allergies-reviewed and updated Allergies  Allergen Reactions   Amoxicillin Nausea And Vomiting   Penicillins    Risperidone  Swelling   Risperidone  And Related     Social History   Socioeconomic History   Marital status: Single    Spouse name: Not on file   Number of children: 0   Years of education: 14   Highest education level: Some college, no degree  Occupational History   Occupation: Pensions consultant: [employer]  Tobacco Use   Smoking status: Never   Smokeless tobacco: Never  Vaping Use   Vaping status: Every Day  Substance and Sexual Activity   Alcohol use: Not Currently  Comment: OCC   Drug use: Yes    Comment: uses delta 8, 9, 10   Sexual activity: Not Currently  Other Topics Concern   Not on file  Social History Narrative   Lives with Dad in [place]. Playing video games, watching movies.    Social Drivers of Corporate investment banker Strain: Not on file  Food Insecurity: Not on file  Transportation Needs: Not on file  Physical Activity: Not on file  Stress: Not on file  Social Connections: Not on file        Objective:  Physical Exam: BP 104/68   Pulse 63   Temp (!) 97.3 F (36.3 C) (Temporal)   Ht 5' 7 (1.702 m)   Wt 152 lb 6.4 oz (69.1 kg)   SpO2 98%   BMI 23.87 kg/m   Body mass index is 23.87 kg/m. Wt Readings from Last 3 Encounters:  04/27/24 152 lb 6.4 oz (69.1 kg)  11/09/23 157 lb (71.2 kg)  03/09/23 164 lb 3.9 oz (74.5 kg)   Gen: NAD, resting comfortably HEENT: TMs normal bilaterally. OP clear. No thyromegaly noted.  CV: RRR with no murmurs appreciated Pulm: NWOB, CTAB with no crackles, wheezes, or rhonchi GI: Normal bowel  sounds present. Soft, Nontender, Nondistended. MSK: no edema, cyanosis, or clubbing noted GU: Normal male genitalia.  Ingrown hair noted along left scrotum Skin: warm, dry Neuro: CN2-12 grossly intact. Strength 5/5 in upper and lower extremities. Reflexes symmetric and intact bilaterally.  Psych: Normal affect and thought content     Xan Sparkman M. Kennyth, MD 04/27/2024 8:30 AM

## 2024-04-27 NOTE — Assessment & Plan Note (Signed)
 Following with psychiatry.  On Paxil  20 mg daily.

## 2024-04-27 NOTE — Addendum Note (Signed)
 Addended by: IDA ELORA HERO on: 04/27/2024 08:35 AM   Modules accepted: Orders

## 2024-04-27 NOTE — Assessment & Plan Note (Signed)
 Follows with psychiatry.  On Paxil  20 mg daily and Wellbutrin  150 mg daily.

## 2024-04-27 NOTE — Patient Instructions (Addendum)
 It was very nice to see you today!  We will check blood work today.  We will give your HPV vaccine.   Please continue to work on diet and exercise.  We will check a stool sample for blood.  Please make sure that you are getting plenty of fiber in your diet.  Let us  know if you have any worsening symptoms.  I will see you back in year for your next physical.  Come back sooner if needed.  Return in about 1 year (around 04/27/2025) for Annual Physical.   Take care, Dr Kennyth  PLEASE NOTE:  If you had any lab tests, please let us  know if you have not heard back within a few days. You may see your results on mychart before we have a chance to review them but we will give you a call once they are reviewed by us .   If we ordered any referrals today, please let us  know if you have not heard from their office within the next week.   If you had any urgent prescriptions sent in today, please check with the pharmacy within an hour of our visit to make sure the prescription was transmitted appropriately.   Please try these tips to maintain a healthy lifestyle:  Eat at least 3 REAL meals and 1-2 snacks per day.  Aim for no more than 5 hours between eating.  If you eat breakfast, please do so within one hour of getting up.   Each meal should contain half fruits/vegetables, one quarter protein, and one quarter carbs (no bigger than a computer mouse)  Cut down on sweet beverages. This includes juice, soda, and sweet tea.   Drink at least 1 glass of water with each meal and aim for at least 8 glasses per day  Exercise at least 150 minutes every week.

## 2024-04-28 LAB — HEPATITIS C ANTIBODY: Hepatitis C Ab: NONREACTIVE

## 2024-04-28 LAB — HIV ANTIBODY (ROUTINE TESTING W REFLEX): HIV 1&2 Ab, 4th Generation: NONREACTIVE

## 2024-04-29 ENCOUNTER — Encounter: Payer: Self-pay | Admitting: Behavioral Health

## 2024-04-29 ENCOUNTER — Ambulatory Visit (INDEPENDENT_AMBULATORY_CARE_PROVIDER_SITE_OTHER): Payer: MEDICAID | Admitting: Behavioral Health

## 2024-04-29 ENCOUNTER — Other Ambulatory Visit: Payer: Self-pay

## 2024-04-29 ENCOUNTER — Ambulatory Visit: Payer: Self-pay | Admitting: Family Medicine

## 2024-04-29 ENCOUNTER — Other Ambulatory Visit (HOSPITAL_COMMUNITY): Payer: Self-pay

## 2024-04-29 DIAGNOSIS — F331 Major depressive disorder, recurrent, moderate: Secondary | ICD-10-CM | POA: Diagnosis not present

## 2024-04-29 DIAGNOSIS — F902 Attention-deficit hyperactivity disorder, combined type: Secondary | ICD-10-CM

## 2024-04-29 DIAGNOSIS — F411 Generalized anxiety disorder: Secondary | ICD-10-CM

## 2024-04-29 DIAGNOSIS — F3341 Major depressive disorder, recurrent, in partial remission: Secondary | ICD-10-CM | POA: Diagnosis not present

## 2024-04-29 DIAGNOSIS — F39 Unspecified mood [affective] disorder: Secondary | ICD-10-CM | POA: Diagnosis not present

## 2024-04-29 MED ORDER — AMPHETAMINE-DEXTROAMPHET ER 15 MG PO CP24
15.0000 mg | ORAL_CAPSULE | ORAL | 0 refills | Status: AC
  Filled 2024-05-09 – 2024-05-11 (×2): qty 30, 30d supply, fill #0
  Filled ????-??-??: fill #0

## 2024-04-29 MED ORDER — BUPROPION HCL ER (XL) 150 MG PO TB24
150.0000 mg | ORAL_TABLET | Freq: Every day | ORAL | 1 refills | Status: DC
  Filled 2024-04-29 – 2024-07-06 (×4): qty 90, 90d supply, fill #0
  Filled 2024-07-15 – 2024-10-04 (×3): qty 90, 90d supply, fill #1

## 2024-04-29 MED ORDER — PAROXETINE HCL 20 MG PO TABS
20.0000 mg | ORAL_TABLET | Freq: Every day | ORAL | 1 refills | Status: AC
  Filled 2024-04-29 – 2024-05-09 (×3): qty 90, 90d supply, fill #0
  Filled 2024-07-06 – 2024-07-15 (×2): qty 90, 90d supply, fill #1

## 2024-04-29 NOTE — Progress Notes (Signed)
 [facility]  Patient ID: Patrick Burnett,  MRN: [id number]  PCP: Kennyth Worth HERO, MD  Date of Evaluation: 04/29/2024 Time spent:20 minutes  Chief Complaint:   HISTORY/CURRENT STATUS: HPI 28 year old male presents to this office for follow up and medication refill.  Overall seen some improvement premature ejaculation and anxiety since switching over to Paxil . Currently feels like he is at a good dosage right now and requesting no medication changes. Adderall is working very well, taking as needed.  He is sleeping 7-8 hours per night. No mania, No current SI or HI.    No past psychiatric medication: Vyvanse Concerta Methylphenidate   Individual Medical History/ Review of Systems: Changes? :No   Allergies: Amoxicillin, Penicillins, Risperidone , and Risperidone  and related  Current Medications:  Current Outpatient Medications:    [START ON 05/06/2024] amphetamine -dextroamphetamine  (ADDERALL XR) 15 MG 24 hr capsule, Take 1 capsule by mouth every morning., Disp: 30 capsule, Rfl: 0   buPROPion  (WELLBUTRIN  XL) 150 MG 24 hr tablet, Take 1 tablet (150 mg total) by mouth daily., Disp: 90 tablet, Rfl: 1   Cholecalciferol  (VITAMIN D ) 50 MCG (2000 UT) tablet, Take 1 tablet (2,000 Units total) by mouth daily in the afternoon., Disp: 100 tablet, Rfl: 4   PARoxetine  (PAXIL ) 20 MG tablet, Take 1 tablet (20 mg total) by mouth daily., Disp: 90 tablet, Rfl: 1 Medication Side Effects: none  Family Medical/ Social History: Changes? No  MENTAL HEALTH EXAM:  There were no vitals taken for this visit.There is no height or weight on file to calculate BMI.  General Appearance: Casual  Eye Contact:  Good  Speech:  Clear and Coherent  Volume:  Normal  Mood:  Angry  Affect:  Appropriate  Thought Process:  Coherent  Orientation:  Full (Time, Place, and Person)  Thought Content: Logical   Suicidal Thoughts:  No  Homicidal Thoughts:  No  Memory:  WNL  Judgement:  Good  Insight:  Good   Psychomotor Activity:  Normal  Concentration:  Concentration: Good  Recall:  Good  Fund of Knowledge: Good  Language: Good  Assets:  Desire for Improvement  ADL's:  Intact  Cognition: WNL  Prognosis:  Good    DIAGNOSES:    ICD-10-CM   1. Generalized anxiety disorder  F41.1 PARoxetine  (PAXIL ) 20 MG tablet    buPROPion  (WELLBUTRIN  XL) 150 MG 24 hr tablet    2. Major depressive disorder, recurrent episode, moderate (HCC)  F33.1 PARoxetine  (PAXIL ) 20 MG tablet    3. Recurrent major depression in partial remission (HCC)  F33.41 buPROPion  (WELLBUTRIN  XL) 150 MG 24 hr tablet    4. Unspecified mood (affective) disorder (HCC)  F39 buPROPion  (WELLBUTRIN  XL) 150 MG 24 hr tablet    5. Attention deficit hyperactivity disorder (ADHD), combined type  F90.2 amphetamine -dextroamphetamine  (ADDERALL XR) 15 MG 24 hr capsule      Receiving Psychotherapy: No    RECOMMENDATIONS:  Greater than 50% of  20  min face to face time with patient was spent on counseling and coordination of care. Discussed his moderate improvement with depression and anxiety since changing to Paxil . Now that he has had adequate trial Paxil  seems to be working much better.  Some moderate improvement with premature ejaculation as well.  We agreed: Continue Wellbutrin  150 mg XL daily Continue Adderall 20 XR daily in the am after breakfast To increase Paxil  to 20 mg daily in the am Will follow up in 12 weeks Will report any side effects or worsening  symptoms.  Will continue psychotherapy Provided emergency contact information  Reviewed PDMP       Redell DELENA Pizza, NP

## 2024-04-29 NOTE — Progress Notes (Signed)
 Blood work is all at goal.  Do not need to make any changes to his treatment plan.  He should continue to work on diet and exercise and we can recheck everything in a few years.

## 2024-05-05 ENCOUNTER — Other Ambulatory Visit (HOSPITAL_COMMUNITY): Payer: Self-pay

## 2024-05-07 ENCOUNTER — Other Ambulatory Visit (HOSPITAL_COMMUNITY): Payer: Self-pay

## 2024-05-09 ENCOUNTER — Other Ambulatory Visit (HOSPITAL_COMMUNITY): Payer: Self-pay

## 2024-05-09 ENCOUNTER — Other Ambulatory Visit: Payer: Self-pay

## 2024-05-11 ENCOUNTER — Other Ambulatory Visit (HOSPITAL_BASED_OUTPATIENT_CLINIC_OR_DEPARTMENT_OTHER): Payer: Self-pay

## 2024-05-11 ENCOUNTER — Other Ambulatory Visit (HOSPITAL_COMMUNITY): Payer: Self-pay

## 2024-05-17 ENCOUNTER — Ambulatory Visit (INDEPENDENT_AMBULATORY_CARE_PROVIDER_SITE_OTHER): Payer: MEDICAID | Admitting: Mental Health

## 2024-05-17 DIAGNOSIS — F411 Generalized anxiety disorder: Secondary | ICD-10-CM

## 2024-05-17 DIAGNOSIS — F902 Attention-deficit hyperactivity disorder, combined type: Secondary | ICD-10-CM | POA: Diagnosis not present

## 2024-05-17 NOTE — Progress Notes (Signed)
 [facility]  Name: Patrick Burnett Date: 05/17/2024 MRN: [id number] DOB: 11-26-1995 PCP: Kennyth Worth HERO, MD  Time spent:  49 minutes  Treatment:  ind. therapy  Virtual Visit via Telehealth Note Connected with patient by a telemedicine/telehealth application, with their informed consent, and verified patient privacy and that I am speaking with the correct person using two identifiers. I discussed the limitations, risks, security and privacy concerns of performing psychotherapy and the availability of in person appointments. I also discussed with the patient that there may be a patient responsible charge related to this service. The patient expressed understanding and agreed to proceed. I discussed the treatment planning with the patient. The patient was provided an opportunity to ask questions and all were answered. The patient agreed with the plan and demonstrated an understanding of the instructions. The patient was advised to call  our office if  symptoms worsen or feel they are in a crisis state and need immediate contact.   Therapist Location: office Patient Location: home     Mental Status Exam:    Appearance:    Casual     Behavior:   Appropriate  Motor:   WNL  Speech/Language:    Clear and Coherent  Affect:   Full range   Mood:   Euthymic  Thought process:   Logical, linear, goal directed  Thought content:     WNL  Sensory/Perceptual disturbances:     none  Orientation:   x4  Attention:   Good  Concentration:   Good  Memory:   Intact  Fund of knowledge:    Consistent with age and development  Insight:     Good  Judgment:    Good  Impulse Control:   Good     Reported Symptoms: Anxiety, intermittent depressed mood, ADHD symptoms (distractibility, problems with focus and sustained attention)   Risk Assessment: Danger to Self:  No Self-injurious Behavior: No Danger to Others: No Duty to Warn:no Physical Aggression / Violence:No  Access to  Firearms a concern: No  Gang Involvement:No  Patient / guardian was educated about steps to take if suicide or homicide risk level increases between visits: yes While future psychiatric events cannot be accurately predicted, the patient does not currently require acute inpatient psychiatric care and does not currently meet [place]  involuntary commitment criteria.    Subjective:  Patient engaged in telehealth video visit.  Shared how he continues to do well in his college course work, maintaining high grades mostly A's.  He stated that he has had some intrusive thoughts but these have decreased over the last 2 to 3 months.  These thoughts are typically self depreciating, focusing on things that he may struggle with in terms of ability.  He stated that he can often feel self defeated and the statements in his mind lifestyle this way as well.  He stated that he works to reframe them and this has really helped decrease the frequency he feels over the past month or 2.  Discussed other coping such as mindfulness, thought stopping using mental imagery.  He shared how he struggles at times with procrastination as this has been a chronic issue.  He understands how his ADHD can be associated with this tendency.  Explored coping collaboratively, focusing on at least one task per week that needs to be completed without procrastination.   Interventions:   supportive therapy, motivational interviewing  Diagnoses:    ICD-10-CM   1. Generalized anxiety disorder  F41.1  2. Attention deficit hyperactivity disorder (ADHD), combined type  F90.2          Plan: Patient to follow through with coping as identified in session, continue to utilize his support system which is primarily his girlfriend for support and some other friends.  Long-term goal:  Reduce overall level, frequency, and intensity of distressful emotions when feeling overwhelmed for at least 3 consecutive months per patient report.                               Identify painful past events that continue to impact Patrick Burnett emotionally and work to resolve them and improve coping.  Short-term goal: To identify and process feelings related to the disappointment of past painful events that lead to feelings of sadness, anxiety.                   Improve emotional regulatory skills when feeling stressed and overwhelmed.                              Decrease frequency of procrastination    Lonni Fischer, [facility]

## 2024-05-31 ENCOUNTER — Encounter: Payer: MEDICAID | Admitting: Family Medicine

## 2024-05-31 ENCOUNTER — Ambulatory Visit (INDEPENDENT_AMBULATORY_CARE_PROVIDER_SITE_OTHER): Payer: MEDICAID | Admitting: *Deleted

## 2024-05-31 DIAGNOSIS — Z23 Encounter for immunization: Secondary | ICD-10-CM | POA: Diagnosis not present

## 2024-05-31 NOTE — Progress Notes (Signed)
 Patient present for HPV vaccine #2  Given on left deltoid  Patient tolerated well  Next dose appt schedule

## 2024-06-15 ENCOUNTER — Ambulatory Visit (INDEPENDENT_AMBULATORY_CARE_PROVIDER_SITE_OTHER): Payer: MEDICAID | Admitting: Mental Health

## 2024-06-15 DIAGNOSIS — F411 Generalized anxiety disorder: Secondary | ICD-10-CM

## 2024-06-15 NOTE — Progress Notes (Signed)
 [facility]  Name: Patrick Burnett Date: 06/15/2024 MRN: [id number] DOB: 08/12/1996 PCP: Kennyth Worth HERO, MD  Time spent:  50 minutes  Treatment:  ind. therapy  Virtual Visit via Telehealth Note Connected with patient by a telemedicine/telehealth application, with their informed consent, and verified patient privacy and that I am speaking with the correct person using two identifiers. I discussed the limitations, risks, security and privacy concerns of performing psychotherapy and the availability of in person appointments. I also discussed with the patient that there may be a patient responsible charge related to this service. The patient expressed understanding and agreed to proceed. I discussed the treatment planning with the patient. The patient was provided an opportunity to ask questions and all were answered. The patient agreed with the plan and demonstrated an understanding of the instructions. The patient was advised to call  our office if  symptoms worsen or feel they are in a crisis state and need immediate contact.   Therapist Location: office Patient Location: home     Mental Status Exam:    Appearance:    Casual     Behavior:   Appropriate  Motor:   WNL  Speech/Language:    Clear and Coherent  Affect:   Full range   Mood:   Euthymic  Thought process:   Logical, linear, goal directed  Thought content:     WNL  Sensory/Perceptual disturbances:     none  Orientation:   x4  Attention:   Good  Concentration:   Good  Memory:   Intact  Fund of knowledge:    Consistent with age and development  Insight:     Good  Judgment:    Good  Impulse Control:   Good     Reported Symptoms: Anxiety, intermittent depressed mood, ADHD symptoms (distractibility, problems with focus and sustained attention)   Risk Assessment: Danger to Self:  No Self-injurious Behavior: No Danger to Others: No Duty to Warn:no Physical Aggression / Violence:No  Access to  Firearms a concern: No  Gang Involvement:No  Patient / guardian was educated about steps to take if suicide or homicide risk level increases between visits: yes While future psychiatric events cannot be accurately predicted, the patient does not currently require acute inpatient psychiatric care and does not currently meet [place]  involuntary commitment criteria.    Subjective:  Patient engaged in telehealth video visit.  Assessed progress where he stated that he ended the semester well, making all A's.  He stated he further looks forward to the coming semester which is starting in a couple of days, expresses motivation and intention to keep his grades high.  He stated that he has realized I am the problem.  Through further guided discovery, he identified the need to focus more on the present instead of the future.  He stated he can become anxious and tend to overthink situations.  He went on to share more history related to his being inquisitive this fostered when he was a child by his father as he often read to him and would question on words at a young age.  He stated this along with many other experiences similar, he feels this condition him to think about his future more frequently and recognizes how this can affect him emotionally.  He stated his relationships are going well overall, the relationship with his girlfriend going well.  Ways to cope and care for himself and be more present centered was explored as this was identified  as a need, which will also assist him in maybe reducing some of his anxiety day-to-day.    Interventions:   supportive therapy, motivational interviewing  Diagnoses:    ICD-10-CM   1. Generalized anxiety disorder  F41.1         Plan: Patient to follow through with coping as identified in session, continue to utilize his support system which is primarily his girlfriend for support and some other friends.  Long-term goal:  Reduce overall level, frequency,  and intensity of distressful emotions when feeling overwhelmed for at least 3 consecutive months per patient report.                              Identify painful past events that continue to impact [name] emotionally and work to resolve them and improve coping.  Short-term goal: To identify and process feelings related to the disappointment of past painful events that lead to feelings of sadness, anxiety.                   Improve emotional regulatory skills when feeling stressed and overwhelmed.                              Decrease frequency of procrastination    Lonni Fischer, [facility]

## 2024-06-28 ENCOUNTER — Telehealth: Payer: MEDICAID | Admitting: Behavioral Health

## 2024-07-01 ENCOUNTER — Other Ambulatory Visit (HOSPITAL_BASED_OUTPATIENT_CLINIC_OR_DEPARTMENT_OTHER): Payer: Self-pay

## 2024-07-01 ENCOUNTER — Ambulatory Visit (INDEPENDENT_AMBULATORY_CARE_PROVIDER_SITE_OTHER): Payer: MEDICAID | Admitting: Behavioral Health

## 2024-07-01 ENCOUNTER — Encounter: Payer: Self-pay | Admitting: Behavioral Health

## 2024-07-01 DIAGNOSIS — F22 Delusional disorders: Secondary | ICD-10-CM

## 2024-07-01 DIAGNOSIS — F3110 Bipolar disorder, current episode manic without psychotic features, unspecified: Secondary | ICD-10-CM

## 2024-07-01 DIAGNOSIS — F331 Major depressive disorder, recurrent, moderate: Secondary | ICD-10-CM

## 2024-07-01 DIAGNOSIS — F3341 Major depressive disorder, recurrent, in partial remission: Secondary | ICD-10-CM

## 2024-07-01 DIAGNOSIS — F902 Attention-deficit hyperactivity disorder, combined type: Secondary | ICD-10-CM

## 2024-07-01 DIAGNOSIS — F309 Manic episode, unspecified: Secondary | ICD-10-CM

## 2024-07-01 DIAGNOSIS — F411 Generalized anxiety disorder: Secondary | ICD-10-CM | POA: Diagnosis not present

## 2024-07-01 DIAGNOSIS — F39 Unspecified mood [affective] disorder: Secondary | ICD-10-CM

## 2024-07-01 MED ORDER — OLANZAPINE 10 MG PO TABS
10.0000 mg | ORAL_TABLET | Freq: Every day | ORAL | 1 refills | Status: DC
  Filled 2024-07-01 (×2): qty 30, 30d supply, fill #0
  Filled 2024-07-06 – 2024-07-29 (×4): qty 30, 30d supply, fill #1

## 2024-07-01 MED ORDER — LORAZEPAM 1 MG PO TABS
ORAL_TABLET | ORAL | 0 refills | Status: DC
  Filled 2024-07-01: qty 10, 10d supply, fill #0

## 2024-07-01 MED ORDER — LORAZEPAM 1 MG PO TABS
1.0000 mg | ORAL_TABLET | Freq: Three times a day (TID) | ORAL | 1 refills | Status: DC
  Filled 2024-07-01 (×2): qty 10, 4d supply, fill #0

## 2024-07-01 NOTE — Progress Notes (Signed)
 [facility]  Patient ID: JP EASTHAM,  MRN: [id number]  PCP: Kennyth Worth HERO, MD  Date of Evaluation: 07/05/2024 Time spent:60 minutes  Chief Complaint:  Chief Complaint   Anxiety; Depression; Follow-up; Medication Refill; Patient Education; Agitation; Paranoid; Medication Problem; Manic Behavior     HISTORY/CURRENT STATUS: HPI  20, 28- year old male presents to this office for emergency follow up and medication management. He present to the front admin office extremely agitated and verbally abusive to staff. Says that he will get an appointment and he is not fucking leaving until he is seen. When he initially stopped yelling, had head covered with hands and buried in his lap. Says that his appointment was cancelled on  8/26 and no one called him to [facility].  He continues to scream and says that no one gives a shit and the system is designed for people to die.  Says that he has been living with the pain since he was a child. Says if I do not see someone today, I am going to kill my self on my birthday.  Says that he believes he is having manic episode.  Understands that he has not been taking medication to treat bipolar by choice. Says, my brain will not shut off. He reports minimal sleep over last week.  Denies any auditory or visual hallucinations.  Says that he is miserable and has to have help. Unable to sit still. Say that he normally does not take Adderall every day but had taken it 4 days in a row. Wonders if this could have triggered mania.  He is sleeping 7-8 hours per night. When in office he denies SI or HI. Says that  he will say or do anything he can do to be seen. Says that he will absolutely not go to ER or [facility]. Says that he has been there before. He says, I will lie, I know the system and what I need to say but they will not keep me. I refuse to go.  Says that he would not harm self because he would not want to hurt his family or girl friend, he just needs  help. He did verbally contract for safety with his mother present. She came to office with his verbal consent to pick him up.  He says all weapons including his pistol have been removed from home. Say girlfriend has hid and he has no idea where it is.  He is will to adjust medication and prescribe appropriate medications to treat Bipolar now. Rate his depression today 10/10 and anxiety 10/10.  Agrees to call after hours number if symptoms worsen over the weekend. Agrees to take medication as prescribed and mother says she will assist. Says that he will not be left alone until he improves.    No past psychiatric medication: Vyvanse Concerta Methylphenidate Unsure of prior AP's   Individual Medical History/ Review of Systems: Changes? :No   Allergies: Amoxicillin, Penicillins, Risperidone , and Risperidone  and paliperidone  Current Medications:  Current Outpatient Medications:    LORazepam  (ATIVAN ) 1 MG tablet, Take 1 tablet (1 mg total) by mouth every 8 (eight) hours., Disp: 10 tablet, Rfl: 1   OLANZapine  (ZYPREXA ) 10 MG tablet, Take 1 tablet (10 mg total) by mouth at bedtime., Disp: 30 tablet, Rfl: 1   amphetamine -dextroamphetamine  (ADDERALL XR) 15 MG 24 hr capsule, Take 1 capsule by mouth every morning., Disp: 30 capsule, Rfl: 0   buPROPion  (WELLBUTRIN  XL) 150 MG 24 hr tablet, Take  1 tablet (150 mg total) by mouth daily., Disp: 90 tablet, Rfl: 1   Cholecalciferol  (VITAMIN D ) 50 MCG (2000 UT) tablet, Take 1 tablet (2,000 Units total) by mouth daily in the afternoon., Disp: 100 tablet, Rfl: 4   PARoxetine  (PAXIL ) 20 MG tablet, Take 1 tablet (20 mg total) by mouth daily., Disp: 90 tablet, Rfl: 1 Medication Side Effects: none  Family Medical/ Social History: Changes? No  MENTAL HEALTH EXAM:  There were no vitals taken for this visit.There is no height or weight on file to calculate BMI.  General Appearance: Casual, Neat, and Well Groomed  Eye Contact:  Good  Speech:  Talkative and patient  speech is rapid and yelling  Volume:  Increased  Mood: angry, irritable, distressed, depressed, anxious, manic  Affect:  Congruent, Depressed, Inappropriate, Labile, Anxious, and manic  Thought Process:  Coherent  Orientation:  Full (Time, Place, and Person)  Thought Content: Illogical, Paranoid Ideation, Rumination, and Tangential   Suicidal Thoughts:  No  Homicidal Thoughts:  No  Memory:  WNL  Judgement:  Impaired  Insight:  Lacking  Psychomotor Activity:  Increased  Concentration:  Concentration: Poor  Recall:  Good  Fund of Knowledge: Good  Language: Good  Assets:  Desire for Improvement Resilience Social Support  ADL's:  Intact  Cognition: WNL  Prognosis:  Good    DIAGNOSES:    ICD-10-CM   1. Bipolar I disorder, most recent episode (or current) manic (HCC)  F31.10 LORazepam  (ATIVAN ) 1 MG tablet    2. Attention deficit hyperactivity disorder (ADHD), combined type  F90.2     3. Generalized anxiety disorder  F41.1 DISCONTINUED: LORazepam  (ATIVAN ) 1 MG tablet    4. Recurrent major depression in partial remission (HCC)  F33.41     5. Mania (HCC)  F30.9 OLANZapine  (ZYPREXA ) 10 MG tablet    DISCONTINUED: LORazepam  (ATIVAN ) 1 MG tablet    6. Paranoia (HCC)  F22       Receiving Psychotherapy: yes   RECOMMENDATIONS:   Greater than 50% of  60 min face to face time with patient was spent on counseling and coordination of care. Front Desk staff came to my office to report a patient that was hostile and verbally abusive. When I went to the lobby, I found Zack with his head in his lap.  He immediately started to scream and use profanity.  I asked him if he would be willing to come back to my office to talk in private and he agreed.  He was unable to calm down,  so I requested that he take Ativan  1 mg to help. Obtained from medical suppy and administered.  We had some conversation about his concerns.  Says that he took Adderall several days in a row which he normally does not do.   He reminded me that he was diagnosed with bipolar disorder when he was younger.  However he has opted to not be treated with appropriate bipolar medications the last few years.  He expresses that he has been tormented his entire life.  After about 30 minutes he was able to reason a little bit more appropriately and calm down.  I told him that after the administration of the Ativan  that I would be unable to let him drive home.  I asked him if it would be okay to have his mom come pick him up and he agreed.  We reviewed his medications further and he did recognize that he was in a manic episode.  He  agreed to start olanzapine  tonight at bedtime.  And he would contact this office if he had any negative side effects or increased suicidal ideation.  He assured me that he felt safe and would not harm self or others.  All dangerous weapons were removed from home.  He did seem preoccupied with current political environment.  Exhibited some paranoia that the mental health system was rigged against him.  He told me that he does have strong family history of bipolar disorder.  Says that both his dad and sister have been hospitalized.  He also has prior history of hospitalization.  He agrees to utilize after hours number as necessary and will follow-up with me next Friday.  We agreed:  Will start Olanzapine  5 mg daily at bedtime for 3 days, then 10 mg daily.  To start #10 Ativan  1 mg daily as needed for anxious distress and agitation.  Continue Wellbutrin  150 mg XL daily To hold Adderall 20 XR daily in the am after breakfast. Will not take medication until following up in this office and further advised.  To continue Paxil  to 20 mg daily in the am Will follow up in 1 week Will report any side effects or worsening symptoms.  Will continue psychotherapy Provided emergency contact information  Reviewed PDMP    Redell DELENA Pizza, NP

## 2024-07-06 ENCOUNTER — Other Ambulatory Visit (HOSPITAL_BASED_OUTPATIENT_CLINIC_OR_DEPARTMENT_OTHER): Payer: Self-pay

## 2024-07-07 ENCOUNTER — Other Ambulatory Visit (HOSPITAL_BASED_OUTPATIENT_CLINIC_OR_DEPARTMENT_OTHER): Payer: Self-pay

## 2024-07-07 ENCOUNTER — Other Ambulatory Visit: Payer: Self-pay

## 2024-07-08 ENCOUNTER — Ambulatory Visit (INDEPENDENT_AMBULATORY_CARE_PROVIDER_SITE_OTHER): Payer: MEDICAID | Admitting: Behavioral Health

## 2024-07-08 ENCOUNTER — Encounter: Payer: Self-pay | Admitting: Behavioral Health

## 2024-07-08 DIAGNOSIS — F902 Attention-deficit hyperactivity disorder, combined type: Secondary | ICD-10-CM | POA: Diagnosis not present

## 2024-07-08 DIAGNOSIS — F411 Generalized anxiety disorder: Secondary | ICD-10-CM

## 2024-07-08 DIAGNOSIS — F309 Manic episode, unspecified: Secondary | ICD-10-CM

## 2024-07-08 DIAGNOSIS — F319 Bipolar disorder, unspecified: Secondary | ICD-10-CM

## 2024-07-08 NOTE — Progress Notes (Signed)
 [facility]  Patient ID: Patrick Burnett,  MRN: [id number]  PCP: Kennyth Worth HERO, MD  Date of Evaluation: 07/08/2024 Time spent:20 minutes  Chief Complaint:  Chief Complaint   Depression; Anxiety; Follow-up; Medication Refill; Patient Education; Manic Behavior     HISTORY/CURRENT STATUS: HPI 74, 28- year old male presents to this office for emergency follow up and medication management. Reports much improvement since last weeks severe episode with mania. So fart Olanzapine  is helping to slow his racing thoughts and is sleeping well.  He is calm, pleasant and logical today.   Agrees to continue taking medications as prescribed, and will follow up in 4 weeks.  Rate his depression today 5/10 and anxiety 5/10. Sleeping 8 hours plus per night.  Agrees to call after hours number if symptoms worsen or symptoms are exacerbated.  Agrees to take medication as prescribed and mother says she will assist. Says that he will not be left alone until he improves. Pt feels safe and no current SI or HI.      No past psychiatric medication: Vyvanse Concerta Methylphenidate Risperidone     Individual Medical History/ Review of Systems: Changes? :No   Allergies: Amoxicillin, Penicillins, Risperidone , and Risperidone  and paliperidone  Current Medications:  Current Outpatient Medications:    amphetamine -dextroamphetamine  (ADDERALL XR) 15 MG 24 hr capsule, Take 1 capsule by mouth every morning., Disp: 30 capsule, Rfl: 0   buPROPion  (WELLBUTRIN  XL) 150 MG 24 hr tablet, Take 1 tablet (150 mg total) by mouth daily., Disp: 90 tablet, Rfl: 1   Cholecalciferol  (VITAMIN D ) 50 MCG (2000 UT) tablet, Take 1 tablet (2,000 Units total) by mouth daily in the afternoon., Disp: 100 tablet, Rfl: 4   LORazepam  (ATIVAN ) 1 MG tablet, Take 1 tablet (1 mg total) by mouth every 8 (eight) hours., Disp: 10 tablet, Rfl: 1   OLANZapine  (ZYPREXA ) 10 MG tablet, Take 1 tablet (10 mg total) by mouth at bedtime., Disp:  30 tablet, Rfl: 1   PARoxetine  (PAXIL ) 20 MG tablet, Take 1 tablet (20 mg total) by mouth daily., Disp: 90 tablet, Rfl: 1 Medication Side Effects: none  Family Medical/ Social History: Changes? No  MENTAL HEALTH EXAM:  There were no vitals taken for this visit.There is no height or weight on file to calculate BMI.  General Appearance: Casual, Neat, and Well Groomed  Eye Contact:  Good  Speech:  Clear and Coherent  Volume:  Normal  Mood:  NA  Affect:  Appropriate  Thought Process:  Coherent  Orientation:  Full (Time, Place, and Person)  Thought Content: Logical   Suicidal Thoughts:  No  Homicidal Thoughts:  No  Memory:  WNL  Judgement:  Good  Insight:  Good  Psychomotor Activity:  Normal  Concentration:  Concentration: Good  Recall:  Good  Fund of Knowledge: Good  Language: Good  Assets:  Desire for Improvement  ADL's:  Intact  Cognition: WNL  Prognosis:  Good    DIAGNOSES:    ICD-10-CM   1. Generalized anxiety disorder  F41.1     2. Bipolar I disorder (HCC)  F31.9     3. Mania (HCC)  F30.9     4. Attention deficit hyperactivity disorder (ADHD), combined type  F90.2       Receiving Psychotherapy: No    RECOMMENDATIONS:   Greater than 50% of  20  min face to face time with patient was spent on counseling and coordination of care. Pt was calm and collected today. He is organized and  linear. He has been able to sleep. No current symptoms of mania today. He agrees to continue taking medications as prescribed and will follow up in 4 weeks. Family is assisting with care and observation. Reinforced emergency resources available.     We agreed:   Continue Olanzapine  10 mg daily at bedtime Continue  #10 Ativan  1 mg daily as needed for anxious distress and agitation.  Continue Wellbutrin  150 mg XL daily To hold Adderall 20 XR daily in the am after breakfast. Will not take medication until following up in this office and further advised.  To continue Paxil  to 20 mg  daily in the am Will follow up in 1 month Will report any side effects or worsening symptoms.  Will continue psychotherapy Provided emergency contact information  Reviewed PDMP      Redell DELENA Pizza, NP

## 2024-07-15 ENCOUNTER — Other Ambulatory Visit: Payer: Self-pay

## 2024-07-15 ENCOUNTER — Other Ambulatory Visit (HOSPITAL_BASED_OUTPATIENT_CLINIC_OR_DEPARTMENT_OTHER): Payer: Self-pay

## 2024-07-16 ENCOUNTER — Other Ambulatory Visit (HOSPITAL_BASED_OUTPATIENT_CLINIC_OR_DEPARTMENT_OTHER): Payer: Self-pay

## 2024-07-29 ENCOUNTER — Encounter (HOSPITAL_COMMUNITY): Payer: Self-pay

## 2024-07-29 ENCOUNTER — Other Ambulatory Visit (HOSPITAL_COMMUNITY): Payer: Self-pay

## 2024-08-08 ENCOUNTER — Ambulatory Visit: Payer: MEDICAID | Admitting: Behavioral Health

## 2024-08-08 NOTE — Progress Notes (Signed)
 Pt did not show for scheduled appt and did not provide 24 hour notice as required. Additional fees to be assessed.

## 2024-08-10 ENCOUNTER — Encounter: Payer: Self-pay | Admitting: Behavioral Health

## 2024-08-10 ENCOUNTER — Ambulatory Visit: Payer: MEDICAID | Admitting: Behavioral Health

## 2024-08-10 ENCOUNTER — Other Ambulatory Visit (HOSPITAL_COMMUNITY): Payer: Self-pay

## 2024-08-10 ENCOUNTER — Other Ambulatory Visit: Payer: Self-pay

## 2024-08-10 DIAGNOSIS — F524 Premature ejaculation: Secondary | ICD-10-CM

## 2024-08-10 DIAGNOSIS — F902 Attention-deficit hyperactivity disorder, combined type: Secondary | ICD-10-CM | POA: Diagnosis not present

## 2024-08-10 DIAGNOSIS — F319 Bipolar disorder, unspecified: Secondary | ICD-10-CM | POA: Diagnosis not present

## 2024-08-10 DIAGNOSIS — F3110 Bipolar disorder, current episode manic without psychotic features, unspecified: Secondary | ICD-10-CM

## 2024-08-10 DIAGNOSIS — F411 Generalized anxiety disorder: Secondary | ICD-10-CM

## 2024-08-10 DIAGNOSIS — F331 Major depressive disorder, recurrent, moderate: Secondary | ICD-10-CM

## 2024-08-10 MED ORDER — PAROXETINE HCL 30 MG PO TABS
30.0000 mg | ORAL_TABLET | Freq: Every day | ORAL | 1 refills | Status: DC
  Filled 2024-08-10: qty 30, 30d supply, fill #0
  Filled 2024-08-23 – 2024-09-05 (×3): qty 30, 30d supply, fill #1

## 2024-08-10 MED ORDER — OLANZAPINE 15 MG PO TABS
15.0000 mg | ORAL_TABLET | Freq: Every day | ORAL | 1 refills | Status: AC
  Filled 2024-08-10: qty 30, 30d supply, fill #0
  Filled 2024-08-23 – 2024-09-05 (×4): qty 30, 30d supply, fill #1

## 2024-08-10 MED ORDER — LORAZEPAM 1 MG PO TABS
ORAL_TABLET | ORAL | 1 refills | Status: AC
  Filled 2024-08-10: qty 30, 30d supply, fill #0
  Filled 2024-11-17: qty 30, 30d supply, fill #1

## 2024-08-10 NOTE — Progress Notes (Signed)
 [facility]  Patient ID: Patrick Burnett,  MRN: [id number]  PCP: Kennyth Worth HERO, MD  Date of Evaluation: 08/10/2024 Time spent:30 minutes  Chief Complaint:  Chief Complaint   Anxiety; Depression; Follow-up; Medication Refill; Patient Education; Stress; ADHD     HISTORY/CURRENT STATUS: HPI 55, 28- year old male presents to this office for emergency follow up and medication management. Has been doing better overall but having some increase in anxiety that he says has been problematic. He feels like medication is not working quiet as well as before.   He is utilizing Ativan  as needed for severe anxiety. He reports using sparingly. His girlfriend is monitoring his progress and medications.   Rate his depression today 6/10 and anxiety 8/10. Sleeping 8 hours plus per night.  Agrees to call after hours number if symptoms worsen or symptoms are exacerbated.  Agrees to take medication as prescribed and mother says she will assist. Says that he will not be left alone until he improves. Pt feels safe and no current SI or HI.      No past psychiatric medication: Vyvanse Concerta Methylphenidate Risperidone       Individual Medical History/ Review of Systems: Changes? :No   Allergies: Amoxicillin, Penicillins, Risperidone , and Risperidone  and paliperidone  Current Medications:  Current Outpatient Medications:    OLANZapine  (ZYPREXA ) 15 MG tablet, Take 1 tablet (15 mg total) by mouth at bedtime., Disp: 30 tablet, Rfl: 1   PARoxetine  (PAXIL ) 30 MG tablet, Take 1 tablet (30 mg total) by mouth daily., Disp: 30 tablet, Rfl: 1   amphetamine -dextroamphetamine  (ADDERALL XR) 15 MG 24 hr capsule, Take 1 capsule by mouth every morning., Disp: 30 capsule, Rfl: 0   buPROPion  (WELLBUTRIN  XL) 150 MG 24 hr tablet, Take 1 tablet (150 mg total) by mouth daily., Disp: 90 tablet, Rfl: 1   Cholecalciferol  (VITAMIN D ) 50 MCG (2000 UT) tablet, Take 1 tablet (2,000 Units total) by mouth daily in  the afternoon., Disp: 100 tablet, Rfl: 4   LORazepam  (ATIVAN ) 1 MG tablet, Take 1/2 to 1 tablet by mouth daily as needed for severe anxiety, Disp: 30 tablet, Rfl: 1   OLANZapine  (ZYPREXA ) 10 MG tablet, Take 1 tablet (10 mg total) by mouth at bedtime., Disp: 30 tablet, Rfl: 1   PARoxetine  (PAXIL ) 20 MG tablet, Take 1 tablet (20 mg total) by mouth daily., Disp: 90 tablet, Rfl: 1 Medication Side Effects: none  Family Medical/ Social History: Changes? No  MENTAL HEALTH EXAM:  There were no vitals taken for this visit.There is no height or weight on file to calculate BMI.  General Appearance: Casual, Neat, and Well Groomed  Eye Contact:  Good  Speech:  Clear and Coherent  Volume:  Normal  Mood:  Anxious, Depressed, and Dysphoric  Affect:  Congruent, Depressed, Flat, and Anxious  Thought Process:  Coherent  Orientation:  Full (Time, Place, and Person)  Thought Content: Logical   Suicidal Thoughts:  Yes.  without intent/plan  Homicidal Thoughts:  No  Memory:  WNL  Judgement:  Good  Insight:  Good  Psychomotor Activity:  Normal  Concentration:  Concentration: Good  Recall:  Good  Fund of Knowledge: Good  Language: Good  Assets:  Desire for Improvement  ADL's:  Intact  Cognition: WNL  Prognosis:  Good    DIAGNOSES:    ICD-10-CM   1. Generalized anxiety disorder  F41.1 PARoxetine  (PAXIL ) 30 MG tablet    OLANZapine  (ZYPREXA ) 15 MG tablet    2. Bipolar I disorder (  HCC)  F31.9 PARoxetine  (PAXIL ) 30 MG tablet    OLANZapine  (ZYPREXA ) 15 MG tablet    3. Major depressive disorder, recurrent episode, moderate (HCC)  F33.1 PARoxetine  (PAXIL ) 30 MG tablet    OLANZapine  (ZYPREXA ) 15 MG tablet    4. Premature ejaculation  F52.4     5. Attention deficit hyperactivity disorder (ADHD), combined type  F90.2     6. Bipolar I disorder, most recent episode (or current) manic (HCC)  F31.10 LORazepam  (ATIVAN ) 1 MG tablet      Receiving Psychotherapy: No    RECOMMENDATIONS:   Greater than  50% of  30  min face to face time with patient was spent on counseling and coordination of care. Pt was calm and collected today. He is organized and linear. He has been able to sleep. Talked about his continued recent increase with severe anxiety and depression. He would like to adjust his medication today.  Reinforced emergency resources available.      We agreed:   Increase Olanzapine  to 15 mg daily at bedtime Continue  #10 Ativan  1 mg daily as needed for anxious distress and agitation.  Continue Wellbutrin  150 mg XL daily To hold Adderall 20 XR daily in the am after breakfast. Will not take medication until following up in this office and further advised.  Increase  Paxil  to to 30 mg daily in the am Will follow up in 1 month Will report any side effects or worsening symptoms.  Will continue psychotherapy Provided emergency contact information  Reviewed PDMP    Redell DELENA Pizza, NP

## 2024-08-23 ENCOUNTER — Other Ambulatory Visit: Payer: Self-pay | Admitting: Behavioral Health

## 2024-08-23 ENCOUNTER — Other Ambulatory Visit (HOSPITAL_COMMUNITY): Payer: Self-pay

## 2024-08-23 DIAGNOSIS — F309 Manic episode, unspecified: Secondary | ICD-10-CM

## 2024-08-24 ENCOUNTER — Other Ambulatory Visit (HOSPITAL_COMMUNITY): Payer: Self-pay

## 2024-08-24 ENCOUNTER — Other Ambulatory Visit: Payer: Self-pay

## 2024-08-25 ENCOUNTER — Other Ambulatory Visit (HOSPITAL_COMMUNITY): Payer: Self-pay

## 2024-08-30 ENCOUNTER — Other Ambulatory Visit (HOSPITAL_BASED_OUTPATIENT_CLINIC_OR_DEPARTMENT_OTHER): Payer: Self-pay

## 2024-09-05 ENCOUNTER — Other Ambulatory Visit (HOSPITAL_COMMUNITY): Payer: Self-pay

## 2024-09-05 ENCOUNTER — Other Ambulatory Visit: Payer: Self-pay | Admitting: Behavioral Health

## 2024-09-05 ENCOUNTER — Other Ambulatory Visit: Payer: Self-pay

## 2024-09-05 DIAGNOSIS — F309 Manic episode, unspecified: Secondary | ICD-10-CM

## 2024-09-21 ENCOUNTER — Ambulatory Visit (INDEPENDENT_AMBULATORY_CARE_PROVIDER_SITE_OTHER): Payer: MEDICAID | Admitting: Behavioral Health

## 2024-09-21 ENCOUNTER — Other Ambulatory Visit (HOSPITAL_BASED_OUTPATIENT_CLINIC_OR_DEPARTMENT_OTHER): Payer: Self-pay

## 2024-09-21 ENCOUNTER — Encounter: Payer: Self-pay | Admitting: Behavioral Health

## 2024-09-21 DIAGNOSIS — F309 Manic episode, unspecified: Secondary | ICD-10-CM

## 2024-09-21 MED ORDER — OLANZAPINE 10 MG PO TABS
10.0000 mg | ORAL_TABLET | Freq: Every day | ORAL | 1 refills | Status: AC
Start: 2024-09-21 — End: ?
  Filled 2024-09-21: qty 30, 30d supply, fill #0
  Filled 2024-10-04 – 2024-10-19 (×3): qty 30, 30d supply, fill #1

## 2024-09-21 NOTE — Progress Notes (Signed)
 [facility]  Patient ID: Patrick Burnett,  MRN: [id number]  PCP: Kennyth Worth HERO, MD  Date of Evaluation: 09/21/2024 Time spent:30 minutes  Chief Complaint:  Chief Complaint   Depression; Anxiety; Follow-up; Medication Refill; Patient Education     HISTORY/CURRENT STATUS: HPI  Individual Medical History/ Review of Systems: Changes? :Yes   Allergies: Amoxicillin, Penicillins, Risperidone , and Risperidone  and paliperidone  Current Medications:  Current Outpatient Medications:    amphetamine -dextroamphetamine  (ADDERALL XR) 15 MG 24 hr capsule, Take 1 capsule by mouth every morning., Disp: 30 capsule, Rfl: 0   buPROPion  (WELLBUTRIN  XL) 150 MG 24 hr tablet, Take 1 tablet (150 mg total) by mouth daily., Disp: 90 tablet, Rfl: 1   Cholecalciferol  (VITAMIN D ) 50 MCG (2000 UT) tablet, Take 1 tablet (2,000 Units total) by mouth daily in the afternoon., Disp: 100 tablet, Rfl: 4   LORazepam  (ATIVAN ) 1 MG tablet, Take 1/2 to 1 tablet by mouth daily as needed for severe anxiety, Disp: 30 tablet, Rfl: 1   OLANZapine  (ZYPREXA ) 10 MG tablet, Take 1 tablet (10 mg total) by mouth at bedtime., Disp: 90 tablet, Rfl: 1   OLANZapine  (ZYPREXA ) 15 MG tablet, Take 1 tablet (15 mg total) by mouth at bedtime., Disp: 30 tablet, Rfl: 1   PARoxetine  (PAXIL ) 20 MG tablet, Take 1 tablet (20 mg total) by mouth daily., Disp: 90 tablet, Rfl: 1   PARoxetine  (PAXIL ) 30 MG tablet, Take 1 tablet (30 mg total) by mouth daily., Disp: 30 tablet, Rfl: 1 Medication Side Effects: none  Family Medical/ Social History: Changes? No  MENTAL HEALTH EXAM:  There were no vitals taken for this visit.There is no height or weight on file to calculate BMI.  General Appearance: Casual  Eye Contact:  Good  Speech:  Clear and Coherent  Volume:  Normal  Mood:  Anxious, Depressed, and Dysphoric  Affect:  Congruent, Depressed, and Anxious  Thought Process:  Coherent  Orientation:  Full (Time, Place, and Person)  Thought  Content: Logical   Suicidal Thoughts:  No  Homicidal Thoughts:  No  Memory:  WNL  Judgement:  Good  Insight:  Good  Psychomotor Activity:  Normal  Concentration:  Concentration: Good  Recall:  Good  Fund of Knowledge: Good  Language: Good  Assets:  Desire for Improvement  ADL's:  Intact  Cognition: WNL  Prognosis:  Good    DIAGNOSES:    ICD-10-CM   1. Mania (HCC)  F30.9 OLANZapine  (ZYPREXA ) 10 MG tablet      Receiving Psychotherapy: yes   RECOMMENDATIONS:  Greater than 50% of  30  min face to face time with patient was spent on counseling and coordination of care. Pt was calm and collected today. He is organized and linear. He has been able to sleep.  We talked about his report of some EPS  symptoms.  Says that he is noticing some twitching in his bicep area and muscles.  Also some twitching or irregular muscle activity in the face.  Conducted AIMS and did not see anything of significance today.  However, agreed that we would try reducing his dose of olanzapine  back down to 10 mg daily at bedtime to see if some of his symptoms were alleviated.  We agreed:   Decrease olanzapine  to 10  mg daily at bedtime Continue  #10 Ativan  1 mg daily as needed for anxious distress and agitation.  Continue Wellbutrin  150 mg XL daily To hold Adderall 20 XR daily in the am after breakfast. Will  not take medication until following up in this office and further advised.  Continue Paxil  to to 30 mg daily in the am Will follow up in 1 month Will report any side effects or worsening symptoms.  Will continue psychotherapy Provided emergency contact information  Reviewed PDMP        Redell DELENA Pizza, NP

## 2024-09-24 ENCOUNTER — Other Ambulatory Visit (HOSPITAL_BASED_OUTPATIENT_CLINIC_OR_DEPARTMENT_OTHER): Payer: Self-pay

## 2024-10-04 ENCOUNTER — Other Ambulatory Visit: Payer: Self-pay | Admitting: Behavioral Health

## 2024-10-04 ENCOUNTER — Other Ambulatory Visit (HOSPITAL_BASED_OUTPATIENT_CLINIC_OR_DEPARTMENT_OTHER): Payer: Self-pay

## 2024-10-04 DIAGNOSIS — F331 Major depressive disorder, recurrent, moderate: Secondary | ICD-10-CM

## 2024-10-04 DIAGNOSIS — F411 Generalized anxiety disorder: Secondary | ICD-10-CM

## 2024-10-04 DIAGNOSIS — F319 Bipolar disorder, unspecified: Secondary | ICD-10-CM

## 2024-10-04 MED ORDER — PAROXETINE HCL 30 MG PO TABS
30.0000 mg | ORAL_TABLET | Freq: Every day | ORAL | 0 refills | Status: AC
  Filled 2024-10-04: qty 30, 30d supply, fill #0

## 2024-10-05 ENCOUNTER — Other Ambulatory Visit: Payer: Self-pay

## 2024-10-05 ENCOUNTER — Other Ambulatory Visit (HOSPITAL_BASED_OUTPATIENT_CLINIC_OR_DEPARTMENT_OTHER): Payer: Self-pay

## 2024-10-06 ENCOUNTER — Other Ambulatory Visit: Payer: Self-pay

## 2024-10-17 ENCOUNTER — Other Ambulatory Visit (HOSPITAL_BASED_OUTPATIENT_CLINIC_OR_DEPARTMENT_OTHER): Payer: Self-pay

## 2024-10-19 ENCOUNTER — Other Ambulatory Visit: Payer: Self-pay

## 2024-10-19 ENCOUNTER — Other Ambulatory Visit (HOSPITAL_BASED_OUTPATIENT_CLINIC_OR_DEPARTMENT_OTHER): Payer: Self-pay

## 2024-10-19 ENCOUNTER — Other Ambulatory Visit: Payer: Self-pay | Admitting: Behavioral Health

## 2024-10-19 DIAGNOSIS — F319 Bipolar disorder, unspecified: Secondary | ICD-10-CM

## 2024-10-19 DIAGNOSIS — F331 Major depressive disorder, recurrent, moderate: Secondary | ICD-10-CM

## 2024-10-19 DIAGNOSIS — F411 Generalized anxiety disorder: Secondary | ICD-10-CM

## 2024-10-20 ENCOUNTER — Other Ambulatory Visit (HOSPITAL_BASED_OUTPATIENT_CLINIC_OR_DEPARTMENT_OTHER): Payer: Self-pay

## 2024-10-20 ENCOUNTER — Encounter: Payer: Self-pay | Admitting: Behavioral Health

## 2024-10-20 ENCOUNTER — Other Ambulatory Visit: Payer: Self-pay

## 2024-10-20 ENCOUNTER — Telehealth: Payer: Self-pay

## 2024-10-20 ENCOUNTER — Ambulatory Visit: Payer: MEDICAID | Admitting: Behavioral Health

## 2024-10-20 ENCOUNTER — Ambulatory Visit: Payer: MEDICAID | Admitting: Mental Health

## 2024-10-20 DIAGNOSIS — F309 Manic episode, unspecified: Secondary | ICD-10-CM

## 2024-10-20 DIAGNOSIS — F411 Generalized anxiety disorder: Secondary | ICD-10-CM

## 2024-10-20 DIAGNOSIS — F331 Major depressive disorder, recurrent, moderate: Secondary | ICD-10-CM

## 2024-10-20 DIAGNOSIS — F319 Bipolar disorder, unspecified: Secondary | ICD-10-CM

## 2024-10-20 DIAGNOSIS — F3341 Major depressive disorder, recurrent, in partial remission: Secondary | ICD-10-CM | POA: Diagnosis not present

## 2024-10-20 DIAGNOSIS — F39 Unspecified mood [affective] disorder: Secondary | ICD-10-CM

## 2024-10-20 MED ORDER — PAROXETINE HCL 30 MG PO TABS
30.0000 mg | ORAL_TABLET | Freq: Every day | ORAL | 1 refills | Status: AC
  Filled 2024-10-20 – 2024-11-17 (×3): qty 90, 90d supply, fill #0

## 2024-10-20 MED ORDER — BUPROPION HCL ER (XL) 150 MG PO TB24
150.0000 mg | ORAL_TABLET | Freq: Every day | ORAL | 1 refills | Status: AC
  Filled 2024-10-20 – 2024-11-17 (×2): qty 90, 90d supply, fill #0

## 2024-10-20 MED ORDER — OLANZAPINE 10 MG PO TABS
10.0000 mg | ORAL_TABLET | Freq: Every day | ORAL | 1 refills | Status: AC
  Filled 2024-10-20: qty 90, 90d supply, fill #0
  Filled 2024-10-21: qty 60, 60d supply, fill #0
  Filled 2024-11-17: qty 60, 60d supply, fill #1

## 2024-10-20 NOTE — Progress Notes (Signed)
 [facility]  Patient ID: Patrick Burnett,  MRN: [id number]  PCP: Kennyth Worth HERO, MD  Date of Evaluation: 10/20/2024 Time spent:30 minutes  Chief Complaint:   HISTORY/CURRENT STATUS: HPI 27, 28- year old male presents to this office for emergency follow up and medication management.  He believes that olanzapine  is truly minimizing symptoms of bipolar disorder and manic episodes.  Says he still struggles with some lack of motivation but other than that he is doing very well.  He has questions about when appropriate to reintroduce stimulants to his regimen for ADHD.  He is willing to wait another couple of months before discussing further.  His girlfriend is monitoring his progress and medications.   Rate his depression today 4/10 and anxiety 3/10. Sleeping 8 hours plus per night.  Agrees to call after hours number if symptoms worsen or symptoms are exacerbated.  Agrees to take medication as prescribed and mother says she will assist. Says that he will not be left alone until he improves. Pt feels safe and no current SI or HI.      No past psychiatric medication: Vyvanse Concerta Methylphenidate Risperidone  Individual Medical History/ Review of Systems: Changes? :No   Allergies: Amoxicillin, Penicillins, Risperidone , and Risperidone  and paliperidone  Current Medications: Current Medications[1] Medication Side Effects: none  Family Medical/ Social History: Changes? No  MENTAL HEALTH EXAM:  There were no vitals taken for this visit.There is no height or weight on file to calculate BMI.  General Appearance: Casual, Neat, and Well Groomed  Eye Contact:  Good  Speech:  Clear and Coherent  Volume:  Normal  Mood:  NA  Affect:  Appropriate  Thought Process:  Coherent  Orientation:  Full (Time, Place, and Person)  Thought Content: Logical   Suicidal Thoughts:  No  Homicidal Thoughts:  No  Memory:  WNL  Judgement:  Good  Insight:  Good  Psychomotor Activity:   Normal  Concentration:  Concentration: Good  Recall:  Good  Fund of Knowledge: Good  Language: Good  Assets:  Desire for Improvement  ADL's:  Intact  Cognition: WNL  Prognosis:  Good    DIAGNOSES:    ICD-10-CM   1. Generalized anxiety disorder  F41.1 buPROPion  (WELLBUTRIN  XL) 150 MG 24 hr tablet    PARoxetine  (PAXIL ) 30 MG tablet    2. Recurrent major depression in partial remission  F33.41 buPROPion  (WELLBUTRIN  XL) 150 MG 24 hr tablet    3. Unspecified mood (affective) disorder  F39 buPROPion  (WELLBUTRIN  XL) 150 MG 24 hr tablet    4. Bipolar I disorder (HCC)  F31.9 PARoxetine  (PAXIL ) 30 MG tablet    5. Major depressive disorder, recurrent episode, moderate (HCC)  F33.1 PARoxetine  (PAXIL ) 30 MG tablet    6. Mania (HCC)  F30.9 OLANZapine  (ZYPREXA ) 10 MG tablet      Receiving Psychotherapy: No    RECOMMENDATIONS:  Greater than 50% of  30  min face to face time with patient was spent on counseling and coordination of care. Pt was calm and collected today. He is organized and linear. He has been able to sleep.  Still reporting some twitching symptoms but also says that they have been going on his entire life.  Says that they do not bother him that bad and not 100% sure it is related to the medication.  He agrees that remaining on olanzapine  10 mg is the best plan of care for now.   We agreed:  AIMS=0 10/20/2024 Continue olanzapine  to 10  mg daily at bedtime Continue  #10 Ativan  1 mg daily as needed for anxious distress and agitation.  Continue Wellbutrin  150 mg XL daily To hold Adderall 20 XR daily in the am after breakfast. Will not take medication until following up in this office and further advised.  Continue Paxil  to to 30 mg daily in the am Will follow up in 2 months Will report any side effects or worsening symptoms.  Will continue psychotherapy Provided emergency contact information  Reviewed PDMP      Redell DELENA Pizza, NP     [1]  Current Outpatient Medications:     amphetamine -dextroamphetamine  (ADDERALL XR) 15 MG 24 hr capsule, Take 1 capsule by mouth every morning., Disp: 30 capsule, Rfl: 0   buPROPion  (WELLBUTRIN  XL) 150 MG 24 hr tablet, Take 1 tablet (150 mg total) by mouth daily., Disp: 90 tablet, Rfl: 1   Cholecalciferol  (VITAMIN D ) 50 MCG (2000 UT) tablet, Take 1 tablet (2,000 Units total) by mouth daily in the afternoon., Disp: 100 tablet, Rfl: 4   LORazepam  (ATIVAN ) 1 MG tablet, Take 1/2 to 1 tablet by mouth daily as needed for severe anxiety, Disp: 30 tablet, Rfl: 1   OLANZapine  (ZYPREXA ) 10 MG tablet, Take 1 tablet (10 mg total) by mouth at bedtime., Disp: 90 tablet, Rfl: 1   OLANZapine  (ZYPREXA ) 15 MG tablet, Take 1 tablet (15 mg total) by mouth at bedtime., Disp: 30 tablet, Rfl: 1   PARoxetine  (PAXIL ) 20 MG tablet, Take 1 tablet (20 mg total) by mouth daily., Disp: 90 tablet, Rfl: 1   PARoxetine  (PAXIL ) 30 MG tablet, Take 1 tablet (30 mg total) by mouth daily., Disp: 90 tablet, Rfl: 1

## 2024-10-20 NOTE — Telephone Encounter (Signed)
 PA Olanzapine  10 mg  Perform Rx Medicaid

## 2024-10-20 NOTE — Telephone Encounter (Signed)
 PA approved 10/20/24-10/20/25 Olanzapine  10 mg #30/30

## 2024-10-20 NOTE — Telephone Encounter (Signed)
 Has appt today

## 2024-10-20 NOTE — Progress Notes (Signed)
 [facility]  Name: Patrick Burnett Date: 10/20/2024 MRN: [id number] DOB: 1995-12-23 PCP: Kennyth Worth HERO, MD  Time spent:  49 minutes  Treatment:  ind. therapy  Virtual Visit via Telehealth Note Connected with patient by a telemedicine/telehealth application, with their informed consent, and verified patient privacy and that I am speaking with the correct person using two identifiers. I discussed the limitations, risks, security and privacy concerns of performing psychotherapy and the availability of in person appointments. I also discussed with the patient that there may be a patient responsible charge related to this service. The patient expressed understanding and agreed to proceed. I discussed the treatment planning with the patient. The patient was provided an opportunity to ask questions and all were answered. The patient agreed with the plan and demonstrated an understanding of the instructions. The patient was advised to call  our office if  symptoms worsen or feel they are in a crisis state and need immediate contact.   Therapist Location: office Patient Location: home     Mental Status Exam:    Appearance:    Casual     Behavior:   Appropriate  Motor:   WNL  Speech/Language:    Clear and Coherent  Affect:   Full range   Mood:   Euthymic  Thought process:   Logical, linear, goal directed  Thought content:     WNL  Sensory/Perceptual disturbances:     none  Orientation:   x4  Attention:   Good  Concentration:   Good  Memory:   Intact  Fund of knowledge:    Consistent with age and development  Insight:     Good  Judgment:    Good  Impulse Control:   Good     Reported Symptoms: Anxiety, intermittent depressed mood, ADHD symptoms (distractibility, problems with focus and sustained attention)   Risk Assessment: Danger to Self:  No Self-injurious Behavior: No Danger to Others: No Duty to Warn:no Physical Aggression / Violence:No  Access  to Firearms a concern: No  Gang Involvement:No  Patient / guardian was educated about steps to take if suicide or homicide risk level increases between visits: yes While future psychiatric events cannot be accurately predicted, the patient does not currently require acute inpatient psychiatric care and does not currently meet [facility]  involuntary commitment criteria.   Subjective:  Patient engaged in telehealth video visit.  Assessed progress since last visit which was approximately 3 months ago.  He stated that it has been challenging, increased financial stress recently.  He stated he continues to live with his girlfriend, the relationship is going well overall outside of the recent financial stress. He stated that he had a spending spree in October through November.  He stated this occurs when feeling more stressed. He stated he and his gf are having some problems. He stated she is the bread winner but changed her job, less pay and he now has to work more as well. School has been stressful, the condensed semester schedule that is new for the college. He stated this condensed strategy has been stressful for him. His grades dropped, some C's when historically he gets A's. He met with his provider and stopped stimulant meds as he stated it triggered the mania. He averages about 9-12 hours/sleep/night. Mornings can be difficult, groggy due to taking his meds at night.  Facilitated his identifying what he feels he needs for himself to focus on self-care, ways to destress and keep anxiety low.  He stated he continues to try and work on not thinking too much about his future, although he describes himself as a pensions consultant.  He stated that his plan for college is to complete his associates degree and transfer to [employer] for his bachelor's.  He plans to be mindful of thoughts to where he tends to put pressure on himself, try to find moments where he can destress and enjoy himself although his schedule has  been pretty busy.     Interventions:   supportive therapy, motivational interviewing, CBT  Diagnoses:    ICD-10-CM   1. Bipolar I disorder (HCC)  F31.9     2. Generalized anxiety disorder  F41.1          Plan: Patient to follow through with coping as identified in session, continue to utilize his support system which is primarily his girlfriend for support and some other friends.  Long-term goal:  Reduce overall level, frequency, and intensity of distressful emotions when feeling overwhelmed for at least 3 consecutive months per patient report.                              Identify painful past events that continue to impact [name] emotionally and work to resolve them and improve coping.  Short-term goal: To identify and process feelings related to the disappointment of past painful events that lead to feelings of sadness, anxiety.                   Improve emotional regulatory skills when feeling stressed and overwhelmed.                              Decrease frequency of procrastination    Lonni Fischer, [facility]

## 2024-10-21 ENCOUNTER — Other Ambulatory Visit (HOSPITAL_BASED_OUTPATIENT_CLINIC_OR_DEPARTMENT_OTHER): Payer: Self-pay

## 2024-10-21 ENCOUNTER — Other Ambulatory Visit (HOSPITAL_COMMUNITY): Payer: Self-pay

## 2024-11-01 ENCOUNTER — Ambulatory Visit (INDEPENDENT_AMBULATORY_CARE_PROVIDER_SITE_OTHER): Payer: MEDICAID

## 2024-11-01 DIAGNOSIS — Z23 Encounter for immunization: Secondary | ICD-10-CM

## 2024-11-01 NOTE — Progress Notes (Signed)
 Patient is in office today for a nurse visit for Immunization. Patient Injection was given in the  Right deltoid. Patient tolerated injection well.

## 2024-11-07 ENCOUNTER — Other Ambulatory Visit: Payer: Self-pay

## 2024-11-07 ENCOUNTER — Other Ambulatory Visit (HOSPITAL_COMMUNITY): Payer: Self-pay

## 2024-11-08 ENCOUNTER — Encounter: Payer: Self-pay | Admitting: Pharmacist

## 2024-11-08 ENCOUNTER — Other Ambulatory Visit: Payer: Self-pay

## 2024-11-11 ENCOUNTER — Other Ambulatory Visit: Payer: Self-pay

## 2024-11-17 ENCOUNTER — Other Ambulatory Visit (HOSPITAL_BASED_OUTPATIENT_CLINIC_OR_DEPARTMENT_OTHER): Payer: Self-pay

## 2024-11-17 ENCOUNTER — Ambulatory Visit: Payer: MEDICAID | Admitting: Mental Health

## 2024-11-17 ENCOUNTER — Other Ambulatory Visit: Payer: Self-pay

## 2024-11-17 DIAGNOSIS — F319 Bipolar disorder, unspecified: Secondary | ICD-10-CM | POA: Diagnosis not present

## 2024-11-17 NOTE — Progress Notes (Signed)
 [facility] Psychotherapy Note  Name: Patrick Burnett Date: 11/17/2024 MRN: [id number] DOB: 26-Aug-1996 PCP: [place] HERO, MD  Time spent:  51 minutes  Treatment:  ind. therapy      Mental Status Exam:    Appearance:    Casual     Behavior:   Appropriate  Motor:   WNL  Speech/Language:    Clear and Coherent  Affect:   Full range   Mood:   Euthymic  Thought process:   Logical, linear, goal directed  Thought content:     WNL  Sensory/Perceptual disturbances:     none  Orientation:   x4  Attention:   Good  Concentration:   Good  Memory:   Intact  Fund of knowledge:    Consistent with age and development  Insight:     Good  Judgment:    Good  Impulse Control:   Good     Reported Symptoms: Anxiety, intermittent depressed mood, ADHD symptoms (distractibility, problems with focus and sustained attention)   Risk Assessment: Danger to Self:  No Self-injurious Behavior: No Danger to Others: No Duty to Warn:no Physical Aggression / Violence:No  Access to Firearms a concern: No  Gang Involvement:No  Patient / guardian was educated about steps to take if suicide or homicide risk level increases between visits: yes While future psychiatric events cannot be accurately predicted, the patient does not currently require acute inpatient psychiatric care and does not currently meet [facility]  involuntary commitment criteria.   Subjective:  Patient arrived on time for today's session.  Assessed progress for patient shared how he has continued to cope with stressors related to finances.  He went on to share how he and his girlfriend's split the rent and other bills, how he is tried to have discussions with her but it is a sensitive subject.  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  He is  hopeful that he will be able to get an internship and hopefully a job from that experience.  He ultimately wants to receive his bachelor's degree.  He stated that he is experience more intrusive thoughts recently, he attributes to increased stress levels.  He reports his current medications have been helpful for mood stabilization, denies any mania or other more dramatic mood swings.  Explored ways to cope to release stress where he stated that he plays video games and talks with friends which can be helpful at times.  Discussed other coping, diaphragmatic breathing with mindfulness, steps, examples to be utilized between visits.   Interventions:   supportive therapy, motivational interviewing, CBT  Diagnoses:    ICD-10-CM   1. Bipolar I disorder (HCC)  F31.9           Plan: Patient to follow through with coping as identified in session, continue to utilize his support system which is primarily his girlfriend for support and some other friends.  Long-term goal:  Reduce overall level, frequency, and intensity of distressful emotions when feeling overwhelmed for at least 3 consecutive months per patient report.                              Identify painful past events that continue to impact [name] emotionally and work to resolve them and  improve coping.  Short-term goal: To identify and process feelings related to the disappointment of past painful events that lead to feelings of sadness, anxiety.                   Improve emotional regulatory skills when feeling stressed and overwhelmed.                              Decrease frequency of procrastination    Lonni Fischer, [facility]

## 2024-11-19 ENCOUNTER — Other Ambulatory Visit (HOSPITAL_BASED_OUTPATIENT_CLINIC_OR_DEPARTMENT_OTHER): Payer: Self-pay

## 2024-12-15 ENCOUNTER — Ambulatory Visit: Payer: MEDICAID | Admitting: Behavioral Health

## 2025-04-27 ENCOUNTER — Encounter: Payer: MEDICAID | Admitting: Family Medicine

## 2025-05-01 ENCOUNTER — Encounter: Payer: MEDICAID | Admitting: Family Medicine
